# Patient Record
Sex: Male | Born: 1945
Health system: Southern US, Community
[De-identification: ages and names within clinical notes are randomized; demographics above are authoritative.]

## PROBLEM LIST (undated history)

## (undated) DIAGNOSIS — M75101 Unspecified rotator cuff tear or rupture of right shoulder, not specified as traumatic: Secondary | ICD-10-CM

## (undated) DIAGNOSIS — F419 Anxiety disorder, unspecified: Secondary | ICD-10-CM

## (undated) DIAGNOSIS — I480 Paroxysmal atrial fibrillation: Principal | ICD-10-CM

## (undated) DIAGNOSIS — I10 Essential (primary) hypertension: Secondary | ICD-10-CM

## (undated) DIAGNOSIS — E785 Hyperlipidemia, unspecified: Secondary | ICD-10-CM

## (undated) HISTORY — DX: Paroxysmal atrial fibrillation: I48.0

## (undated) HISTORY — PX: HERNIA REPAIR: SHX51

## (undated) HISTORY — PX: TONSILLECTOMY AND ADENOIDECTOMY: SUR1326

## (undated) HISTORY — DX: Hyperlipidemia, unspecified: E78.5

---

## 2003-07-12 ENCOUNTER — Encounter: Admission: RE | Admit: 2003-07-12 | Discharge: 2003-08-10 | Payer: Self-pay | Admitting: Family Medicine

## 2011-05-13 ENCOUNTER — Encounter (HOSPITAL_COMMUNITY): Payer: Self-pay

## 2011-05-13 ENCOUNTER — Emergency Department (HOSPITAL_COMMUNITY): Payer: BC Managed Care – PPO

## 2011-05-13 ENCOUNTER — Emergency Department (HOSPITAL_COMMUNITY)
Admission: EM | Admit: 2011-05-13 | Discharge: 2011-05-13 | Disposition: A | Discharge status: Home / Self Care | Payer: BC Managed Care – PPO | Attending: Emergency Medicine | Admitting: Emergency Medicine

## 2011-05-13 DIAGNOSIS — F29 Unspecified psychosis not due to a substance or known physiological condition: Secondary | ICD-10-CM | POA: Insufficient documentation

## 2011-05-13 DIAGNOSIS — R5383 Other fatigue: Secondary | ICD-10-CM | POA: Insufficient documentation

## 2011-05-13 DIAGNOSIS — R4701 Aphasia: Secondary | ICD-10-CM | POA: Insufficient documentation

## 2011-05-13 DIAGNOSIS — E785 Hyperlipidemia, unspecified: Secondary | ICD-10-CM | POA: Insufficient documentation

## 2011-05-13 DIAGNOSIS — E119 Type 2 diabetes mellitus without complications: Secondary | ICD-10-CM | POA: Insufficient documentation

## 2011-05-13 DIAGNOSIS — R61 Generalized hyperhidrosis: Secondary | ICD-10-CM | POA: Insufficient documentation

## 2011-05-13 DIAGNOSIS — Z79899 Other long term (current) drug therapy: Secondary | ICD-10-CM | POA: Insufficient documentation

## 2011-05-13 DIAGNOSIS — R000 Tachycardia, unspecified: Secondary | ICD-10-CM | POA: Insufficient documentation

## 2011-05-13 DIAGNOSIS — N390 Urinary tract infection, site not specified: Secondary | ICD-10-CM | POA: Insufficient documentation

## 2011-05-13 DIAGNOSIS — I10 Essential (primary) hypertension: Secondary | ICD-10-CM | POA: Insufficient documentation

## 2011-05-13 DIAGNOSIS — R4182 Altered mental status, unspecified: Secondary | ICD-10-CM | POA: Insufficient documentation

## 2011-05-13 DIAGNOSIS — R0602 Shortness of breath: Secondary | ICD-10-CM | POA: Insufficient documentation

## 2011-05-13 DIAGNOSIS — R5381 Other malaise: Secondary | ICD-10-CM | POA: Insufficient documentation

## 2011-05-13 HISTORY — DX: Essential (primary) hypertension: I10

## 2011-05-13 LAB — URINALYSIS, ROUTINE W REFLEX MICROSCOPIC
Bilirubin Urine: NEGATIVE
Glucose, UA: NEGATIVE mg/dL
Ketones, ur: 15 mg/dL — AB
Protein, ur: NEGATIVE mg/dL

## 2011-05-13 LAB — URINE MICROSCOPIC-ADD ON

## 2011-05-13 LAB — DIFFERENTIAL
Basophils Absolute: 0 10*3/uL (ref 0.0–0.1)
Basophils Relative: 0 % (ref 0–1)
Eosinophils Absolute: 0 10*3/uL (ref 0.0–0.7)
Monocytes Absolute: 0 10*3/uL — ABNORMAL LOW (ref 0.1–1.0)
Monocytes Relative: 0 % — ABNORMAL LOW (ref 3–12)
Neutrophils Relative %: 97 % — ABNORMAL HIGH (ref 43–77)

## 2011-05-13 LAB — BASIC METABOLIC PANEL
BUN: 21 mg/dL (ref 6–23)
CO2: 23 mEq/L (ref 19–32)
Calcium: 8.6 mg/dL (ref 8.4–10.5)
Creatinine, Ser: 0.81 mg/dL (ref 0.50–1.35)
Glucose, Bld: 143 mg/dL — ABNORMAL HIGH (ref 70–99)

## 2011-05-13 LAB — CBC
MCH: 29 pg (ref 26.0–34.0)
MCHC: 33.1 g/dL (ref 30.0–36.0)
Platelets: 133 10*3/uL — ABNORMAL LOW (ref 150–400)
RBC: 4.49 MIL/uL (ref 4.22–5.81)

## 2011-05-13 LAB — CK TOTAL AND CKMB (NOT AT ARMC)
CK, MB: 4.8 ng/mL — ABNORMAL HIGH (ref 0.3–4.0)
Total CK: 204 U/L (ref 7–232)

## 2011-07-21 ENCOUNTER — Encounter (INDEPENDENT_AMBULATORY_CARE_PROVIDER_SITE_OTHER): Payer: Self-pay | Admitting: Surgery

## 2011-07-24 ENCOUNTER — Ambulatory Visit (INDEPENDENT_AMBULATORY_CARE_PROVIDER_SITE_OTHER): Payer: BC Managed Care – PPO | Admitting: Surgery

## 2011-07-24 ENCOUNTER — Encounter (INDEPENDENT_AMBULATORY_CARE_PROVIDER_SITE_OTHER): Payer: Self-pay | Admitting: Surgery

## 2011-07-24 VITALS — BP 138/84 | HR 60 | Temp 97.2°F | Resp 16 | Ht 67.0 in | Wt 153.0 lb

## 2011-07-24 DIAGNOSIS — K4090 Unilateral inguinal hernia, without obstruction or gangrene, not specified as recurrent: Secondary | ICD-10-CM | POA: Insufficient documentation

## 2011-07-24 NOTE — Patient Instructions (Signed)
Hold your aspirin for five days before surgery.

## 2011-07-24 NOTE — Progress Notes (Signed)
Chief Complaint  Patient presents with  . discuss surgery for LIH    HPI Daniel Taylor is a 65 y.o. male. I saw him 2 years ago for a left inguinal hernia. The patient chose not to have it repaired at that time. Has gotten significantly larger last couple years but remains reducible. He denies any obstructive symptoms. No tenderness. HPI  Past Medical History  Diagnosis Date  . Hypertension   . Diabetes mellitus   . Hyperlipidemia     Past Surgical History  Procedure Date  . Hernia repair as baby  . Tonsillectomy and adenoidectomy 55 years old    Family History  Problem Relation Age of Onset  . Cancer Father     Social History History  Substance Use Topics  . Smoking status: Never Smoker   . Smokeless tobacco: Not on file  . Alcohol Use: Yes    No Known Allergies  Current Outpatient Prescriptions  Medication Sig Dispense Refill  . aspirin 325 MG EC tablet Take 325 mg by mouth daily.        Marland Kitchen atorvastatin (LIPITOR) 10 MG tablet Take 10 mg by mouth daily.        Marland Kitchen lisinopril (PRINIVIL,ZESTRIL) 40 MG tablet Take 40 mg by mouth daily.        . metFORMIN (GLUCOPHAGE) 500 MG tablet Take 500 mg by mouth 2 (two) times daily with a meal.          Review of Systems Review of Systems ROS reviewed with patient and is otherwise negative Blood pressure 138/84, pulse 60, temperature 97.2 F (36.2 C), temperature source Temporal, resp. rate 16, height 5\' 7"  (1.702 m), weight 153 lb (69.4 kg).  Physical Exam Physical Exam WDWN in NAD HEENT:  EOMI, sclera anicteric Neck:  No masses, no thyromegaly Lungs:  CTA bilaterally; normal respiratory effort CV:  Regular rate and rhythm; no murmurs Abd:  +bowel sounds, soft, non-tender, no masses GU:  Bilateral descended testicles; no testicular masses; reducible large left inguinal hernia; no sign of right inguinal hernia on Valsalva maneuver. Ext:  Well-perfused; no edema Skin:  Warm, dry; no sign of jaundice  Data  Reviewed None  Assessment    Large reducible left inguinal hernia    Plan    Left inguinal hernia repair with mesh.  I described the procedure in detail.  The patient was given educational material. We discussed the risks and benefits including but not limited to bleeding, infection, chronic inguinal pain, nerve entrapment, hernia recurrence, mesh complications, hematoma formation, urinary retention, injury to the testicles, numbness in the groin, blood clots, injury to the surrounding structures, and anesthesia risk. We also discussed the typical post operative recovery course, including no heavy lifting for 6 weeks.  Likelihood of improvement in symptoms is high.        Ajai Harville K. 07/24/2011, 11:31 AM

## 2011-07-28 ENCOUNTER — Encounter (INDEPENDENT_AMBULATORY_CARE_PROVIDER_SITE_OTHER): Payer: Self-pay | Admitting: Surgery

## 2011-08-29 ENCOUNTER — Encounter (HOSPITAL_COMMUNITY): Payer: Self-pay

## 2011-09-08 ENCOUNTER — Encounter (HOSPITAL_COMMUNITY): Payer: Self-pay

## 2011-09-08 ENCOUNTER — Encounter (HOSPITAL_COMMUNITY)
Admission: RE | Admit: 2011-09-08 | Discharge: 2011-09-08 | Disposition: A | Discharge status: Home / Self Care | Payer: BC Managed Care – PPO | Source: Ambulatory Visit | Attending: Surgery | Admitting: Surgery

## 2011-09-08 HISTORY — DX: Anxiety disorder, unspecified: F41.9

## 2011-09-08 LAB — BASIC METABOLIC PANEL
CO2: 28 mEq/L (ref 19–32)
Calcium: 9.6 mg/dL (ref 8.4–10.5)
Chloride: 103 mEq/L (ref 96–112)
Glucose, Bld: 167 mg/dL — ABNORMAL HIGH (ref 70–99)
Sodium: 142 mEq/L (ref 135–145)

## 2011-09-08 LAB — CBC
Hemoglobin: 13.7 g/dL (ref 13.0–17.0)
MCH: 28.8 pg (ref 26.0–34.0)
MCV: 89.5 fL (ref 78.0–100.0)
RBC: 4.76 MIL/uL (ref 4.22–5.81)

## 2011-09-08 NOTE — Progress Notes (Signed)
ekg  And cxr in epic

## 2011-09-08 NOTE — Pre-Procedure Instructions (Signed)
21 Daniel Taylor  09/08/2011   Your procedure is scheduled on:  09/10/11  Report to [facility] at 830 AM.  Call this number if you have problems the morning of surgery: [phone]   Remember:   Do not eat food:After Midnight.  May have clear liquids: up to 4 Hours before arrival.  Clear liquids include soda, tea, black coffee, apple or grape juice, broth.  Take these medicines the morning of surgery with A SIP OF WATER: none   Do not wear jewelry, make-up or nail polish.  Do not wear lotions, powders, or perfumes. You may wear deodorant.  Do not shave 48 hours prior to surgery.  Do not bring valuables to the hospital.  Contacts, dentures or bridgework may not be worn into surgery.  Leave suitcase in the car. After surgery it may be brought to your room.  For patients admitted to the hospital, checkout time is 11:00 AM the day of discharge.   Patients discharged the day of surgery will not be allowed to drive home.  Name and phone number of your driver: family  Special Instructions: CHG Shower Use Special Wash: 1/2 bottle night before surgery and 1/2 bottle morning of surgery.   Please read over the following fact sheets that you were given: MRSA Information and Surgical Site Infection Prevention

## 2011-09-09 MED ORDER — CEFAZOLIN SODIUM 1-5 GM-% IV SOLN
1.0000 g | INTRAVENOUS | Status: AC
  Administered 2011-09-10: 1 g via INTRAVENOUS
  Filled 2011-09-09: qty 50

## 2011-09-10 ENCOUNTER — Encounter (HOSPITAL_COMMUNITY): Payer: Self-pay | Admitting: Surgery

## 2011-09-10 ENCOUNTER — Encounter (HOSPITAL_COMMUNITY): Payer: Self-pay | Admitting: Anesthesiology

## 2011-09-10 ENCOUNTER — Ambulatory Visit (HOSPITAL_COMMUNITY)
Admission: RE | Admit: 2011-09-10 | Discharge: 2011-09-10 | Disposition: A | Discharge status: Home / Self Care | Payer: BC Managed Care – PPO | Source: Ambulatory Visit | Attending: Surgery | Admitting: Surgery

## 2011-09-10 ENCOUNTER — Ambulatory Visit (HOSPITAL_COMMUNITY): Payer: BC Managed Care – PPO | Admitting: Anesthesiology

## 2011-09-10 ENCOUNTER — Encounter (HOSPITAL_COMMUNITY)
Admission: RE | Disposition: A | Discharge status: Home / Self Care | Payer: Self-pay | Source: Ambulatory Visit | Attending: Surgery

## 2011-09-10 DIAGNOSIS — K4090 Unilateral inguinal hernia, without obstruction or gangrene, not specified as recurrent: Secondary | ICD-10-CM

## 2011-09-10 DIAGNOSIS — E119 Type 2 diabetes mellitus without complications: Secondary | ICD-10-CM | POA: Insufficient documentation

## 2011-09-10 DIAGNOSIS — K4091 Unilateral inguinal hernia, without obstruction or gangrene, recurrent: Secondary | ICD-10-CM | POA: Insufficient documentation

## 2011-09-10 DIAGNOSIS — I10 Essential (primary) hypertension: Secondary | ICD-10-CM | POA: Insufficient documentation

## 2011-09-10 DIAGNOSIS — Z01812 Encounter for preprocedural laboratory examination: Secondary | ICD-10-CM | POA: Insufficient documentation

## 2011-09-10 HISTORY — PX: INGUINAL HERNIA REPAIR: SHX194

## 2011-09-10 SURGERY — REPAIR, HERNIA, INGUINAL, ADULT
Anesthesia: General | Site: Groin | Laterality: Left | Wound class: Clean

## 2011-09-10 MED ORDER — ARTIFICIAL TEARS OP OINT
TOPICAL_OINTMENT | OPHTHALMIC | Status: DC | PRN
  Administered 2011-09-10: 1 via OPHTHALMIC

## 2011-09-10 MED ORDER — ROCURONIUM BROMIDE 100 MG/10ML IV SOLN
INTRAVENOUS | Status: DC | PRN
  Administered 2011-09-10: 30 mg via INTRAVENOUS

## 2011-09-10 MED ORDER — OXYCODONE-ACETAMINOPHEN 5-325 MG PO TABS: 1.0000 | ORAL_TABLET | ORAL | Status: AC | PRN

## 2011-09-10 MED ORDER — LACTATED RINGERS IV SOLN
INTRAVENOUS | Status: DC
  Administered 2011-09-10 (×3): via INTRAVENOUS

## 2011-09-10 MED ORDER — HYDROMORPHONE HCL PF 1 MG/ML IJ SOLN
0.2500 mg | INTRAMUSCULAR | Status: DC | PRN
  Administered 2011-09-10: 0.29 mg via INTRAVENOUS

## 2011-09-10 MED ORDER — ONDANSETRON HCL 4 MG/2ML IJ SOLN: 4.0000 mg | Freq: Once | INTRAMUSCULAR | Status: DC | PRN

## 2011-09-10 MED ORDER — MIDAZOLAM HCL 5 MG/5ML IJ SOLN
INTRAMUSCULAR | Status: DC | PRN
  Administered 2011-09-10: 2 mg via INTRAVENOUS

## 2011-09-10 MED ORDER — KETOROLAC TROMETHAMINE 30 MG/ML IJ SOLN
INTRAMUSCULAR | Status: DC | PRN
  Administered 2011-09-10: 30 mg via INTRAVENOUS

## 2011-09-10 MED ORDER — FLUMAZENIL 0.5 MG/5ML IV SOLN
0.5000 mg | Freq: Once | INTRAVENOUS | Status: AC
  Administered 2011-09-10: 0.5 mg via INTRAVENOUS

## 2011-09-10 MED ORDER — HYDROMORPHONE HCL PF 1 MG/ML IJ SOLN: 1.0000 mg | INTRAMUSCULAR | Status: DC | PRN

## 2011-09-10 MED ORDER — ONDANSETRON HCL 4 MG/2ML IJ SOLN
INTRAMUSCULAR | Status: DC | PRN
  Administered 2011-09-10: 4 mg via INTRAVENOUS

## 2011-09-10 MED ORDER — GLYCOPYRROLATE 0.2 MG/ML IJ SOLN
INTRAMUSCULAR | Status: DC | PRN
  Administered 2011-09-10 (×2): 0.2 mg via INTRAVENOUS

## 2011-09-10 MED ORDER — BUPIVACAINE LIPOSOME 1.3 % IJ SUSP
20.0000 mL | Freq: Once | INTRAMUSCULAR | Status: DC
  Filled 2011-09-10: qty 20

## 2011-09-10 MED ORDER — FENTANYL CITRATE 0.05 MG/ML IJ SOLN
INTRAMUSCULAR | Status: DC | PRN
  Administered 2011-09-10: 100 ug via INTRAVENOUS

## 2011-09-10 MED ORDER — NEOSTIGMINE METHYLSULFATE 1 MG/ML IJ SOLN
INTRAMUSCULAR | Status: DC | PRN
  Administered 2011-09-10: 3 mg via INTRAVENOUS

## 2011-09-10 MED ORDER — PROPOFOL 10 MG/ML IV EMUL
INTRAVENOUS | Status: DC | PRN
  Administered 2011-09-10: 200 mg via INTRAVENOUS

## 2011-09-10 MED ORDER — OXYCODONE-ACETAMINOPHEN 5-325 MG PO TABS: 1.0000 | ORAL_TABLET | ORAL | Status: DC | PRN

## 2011-09-10 MED ORDER — BUPIVACAINE LIPOSOME 1.3 % IJ SUSP
INTRAMUSCULAR | Status: DC | PRN
  Administered 2011-09-10: 20 mL

## 2011-09-10 MED ORDER — FLUMAZENIL 0.5 MG/5ML IV SOLN
INTRAVENOUS | Status: AC
  Filled 2011-09-10: qty 5

## 2011-09-10 MED ORDER — ONDANSETRON HCL 4 MG/2ML IJ SOLN: 4.0000 mg | INTRAMUSCULAR | Status: DC | PRN

## 2011-09-10 SURGICAL SUPPLY — 52 items
BENZOIN TINCTURE PRP APPL 2/3 (GAUZE/BANDAGES/DRESSINGS) ×2 IMPLANT
BLADE SURG 15 STRL LF DISP TIS (BLADE) ×1 IMPLANT
BLADE SURG 15 STRL SS (BLADE) ×1
BLADE SURG ROTATE 9660 (MISCELLANEOUS) ×2 IMPLANT
CHLORAPREP W/TINT 26ML (MISCELLANEOUS) ×2 IMPLANT
CLOSURE STERI STRIP 1/2 X4 (GAUZE/BANDAGES/DRESSINGS) ×2 IMPLANT
CLOTH BEACON ORANGE TIMEOUT ST (SAFETY) ×2 IMPLANT
COVER SURGICAL LIGHT HANDLE (MISCELLANEOUS) ×2 IMPLANT
DECANTER SPIKE VIAL GLASS SM (MISCELLANEOUS) ×2 IMPLANT
DERMABOND ADVANCED (GAUZE/BANDAGES/DRESSINGS) ×1
DERMABOND ADVANCED .7 DNX12 (GAUZE/BANDAGES/DRESSINGS) ×1 IMPLANT
DRAIN PENROSE 1/2X12 LTX STRL (WOUND CARE) ×2 IMPLANT
DRAPE LAPAROSCOPIC ABDOMINAL (DRAPES) ×2 IMPLANT
DRAPE LAPAROTOMY TRNSV 102X78 (DRAPE) ×2 IMPLANT
DRAPE UTILITY 15X26 W/TAPE STR (DRAPE) ×4 IMPLANT
DRSG TEGADERM 4X4.75 (GAUZE/BANDAGES/DRESSINGS) ×2 IMPLANT
ELECT CAUTERY BLADE 6.4 (BLADE) ×2 IMPLANT
ELECT REM PT RETURN 9FT ADLT (ELECTROSURGICAL) ×2
ELECTRODE REM PT RTRN 9FT ADLT (ELECTROSURGICAL) ×1 IMPLANT
GAUZE SPONGE 4X4 16PLY XRAY LF (GAUZE/BANDAGES/DRESSINGS) ×2 IMPLANT
GLOVE BIO SURGEON STRL SZ7 (GLOVE) ×2 IMPLANT
GLOVE BIOGEL PI IND STRL 7.5 (GLOVE) ×1 IMPLANT
GLOVE BIOGEL PI INDICATOR 7.5 (GLOVE) ×1
GOWN STRL NON-REIN LRG LVL3 (GOWN DISPOSABLE) ×4 IMPLANT
KIT BASIN OR (CUSTOM PROCEDURE TRAY) ×2 IMPLANT
KIT ROOM TURNOVER OR (KITS) ×2 IMPLANT
MESH ULTRAPRO 3X6 7.6X15CM (Mesh General) ×2 IMPLANT
NEEDLE HYPO 25GX1X1/2 BEV (NEEDLE) ×2 IMPLANT
NS IRRIG 1000ML POUR BTL (IV SOLUTION) ×2 IMPLANT
PACK SURGICAL SETUP 50X90 (CUSTOM PROCEDURE TRAY) ×2 IMPLANT
PAD ARMBOARD 7.5X6 YLW CONV (MISCELLANEOUS) ×2 IMPLANT
PENCIL BUTTON HOLSTER BLD 10FT (ELECTRODE) ×2 IMPLANT
SPECIMEN JAR SMALL (MISCELLANEOUS) IMPLANT
SPONGE GAUZE 4X4 12PLY (GAUZE/BANDAGES/DRESSINGS) ×2 IMPLANT
SPONGE GAUZE 4X4 STERILE 39 (GAUZE/BANDAGES/DRESSINGS) ×2 IMPLANT
SPONGE INTESTINAL PEANUT (DISPOSABLE) ×2 IMPLANT
STRIP CLOSURE SKIN 1/2X4 (GAUZE/BANDAGES/DRESSINGS) ×2 IMPLANT
SUT MNCRL AB 4-0 PS2 18 (SUTURE) ×2 IMPLANT
SUT PDS AB 0 CT 36 (SUTURE) ×2 IMPLANT
SUT PROLENE 2 0 SH DA (SUTURE) ×2 IMPLANT
SUT SILK 2 0 SH (SUTURE) IMPLANT
SUT SILK 3 0 (SUTURE) ×1
SUT SILK 3-0 18XBRD TIE 12 (SUTURE) ×1 IMPLANT
SUT VIC AB 0 CT2 27 (SUTURE) ×2 IMPLANT
SUT VIC AB 2-0 SH 27 (SUTURE)
SUT VIC AB 2-0 SH 27X BRD (SUTURE) IMPLANT
SUT VIC AB 3-0 SH 27 (SUTURE)
SUT VIC AB 3-0 SH 27XBRD (SUTURE) IMPLANT
SYR CONTROL 10ML LL (SYRINGE) ×2 IMPLANT
TOWEL OR 17X24 6PK STRL BLUE (TOWEL DISPOSABLE) ×2 IMPLANT
TOWEL OR 17X26 10 PK STRL BLUE (TOWEL DISPOSABLE) ×2 IMPLANT
WATER STERILE IRR 1000ML POUR (IV SOLUTION) IMPLANT

## 2011-09-10 NOTE — Anesthesia Postprocedure Evaluation (Signed)
  Anesthesia Post-op Note  Patient: Daniel Taylor  Procedure(s) Performed:  HERNIA REPAIR INGUINAL ADULT - REPAIR LEFT HERNIA INGUINAL ADULT WITH MESH  Patient Location: PACU  Anesthesia Type: General  Level of Consciousness: oriented, sedated and patient cooperative  Airway and Oxygen Therapy: Patient Spontanous Breathing and Patient connected to nasal cannula oxygen  Post-op Pain: mild  Post-op Assessment: Post-op Vital signs reviewed, Patient's Cardiovascular Status Stable, Respiratory Function Stable, Patent Airway, No signs of Nausea or vomiting and Pain level controlled  Post-op Vital Signs: stable  Complications: No apparent anesthesia complications

## 2011-09-10 NOTE — Op Note (Signed)
Hernia, Open, Procedure Note  Indications: The patient presented with a history of a left, reducible inguinal hernia.    Pre-operative Diagnosis: left reducible  Post-operative Diagnosis: same  Surgeon: Wynona Luna.   Assistants: none  Anesthesia: General endotracheal anesthesia  ASA Class: 3  Procedure Details  The patient was seen again in the Holding Room. The risks, benefits, complications, treatment options, and expected outcomes were discussed with the patient. The possibilities of reaction to medication, pulmonary aspiration, perforation of viscus, bleeding, recurrent infection, the need for additional procedures, and development of a complication requiring transfusion or further operation were discussed with the patient and/or family. The likelihood of success in repairing the hernia and returning the patient to their previous functional status is good.  There was concurrence with the proposed plan, and informed consent was obtained. The site of surgery was properly noted/marked. The patient was taken to the Operating Room, identified as Daniel Taylor, and the procedure verified as left inguinal hernia repair. A Time Out was held and the above information confirmed.  The patient was placed in the supine position and underwent induction of anesthesia. The lower abdomen and groin was prepped with Chloraprep and draped in the standard fashion, and 0.5% Marcaine with epinephrine was used to anesthetize the skin over the mid-portion of the inguinal canal. An oblique incision was made.  He had a previous left inguinal incision from a childhood hernia repair, but this was too low for our purpose. Dissection was carried down through the subcutaneous tissue with cautery to the external oblique fascia.  There was considerable scarring inferiorly. We opened the external oblique fascia along the direction of its fibers to the external ring.  The spermatic cord was circumferentially dissected  bluntly and retracted with a Penrose drain.  The floor of the inguinal canal was inspected and had a large direct hernia defect.  We skeletonized the spermatic cord and there was no indirect hernia noted.  The floor of the inguinal canal was closed with a running 0 Vicryl.  We used a 3 x 6 inch piece of Ultrapro mesh, which was cut into a keyhole shape.  This was secured with 2-0 Prolene, beginning at the pubic tubercle, running this along the internal oblique fascia superiorly and the shelving edge inferiorly.  The tails of the mesh were sutured together behind the spermatic cord.  The mesh was tucked underneath the external oblique fascia laterally.  The external oblique fascia was reapproximated with 2-0 Vicryl. 20 ml of Exparel was infiltrated in the subcutaneous tissues. 3-0 Vicryl was used to close the subcutaneous tissues and 4-0 Monocryl was used to close the skin in subcuticular fashion.  Benzoin and steri-strips were used to seal the incision.  A clean dressing was applied.  The patient was then extubated and brought to the recovery room in stable condition.  All sponge, instrument, and needle counts were correct prior to closure and at the conclusion of the case.   Estimated Blood Loss: Minimal                 Complications: None; patient tolerated the procedure well.         Disposition: PACU - hemodynamically stable.         Condition: stable

## 2011-09-10 NOTE — H&P (Signed)
Progress Notes     Chief Complaint   Patient presents with   .  discuss surgery for LIH        HPI Daniel Taylor is a 65 y.o. male. I saw him 2 years ago for a left inguinal hernia. The patient chose not to have it repaired at that time. Has gotten significantly larger last couple years but remains reducible. He denies any obstructive symptoms. No tenderness. HPI    Past Medical History   Diagnosis  Date   .  Hypertension     .  Diabetes mellitus     .  Hyperlipidemia           Past Surgical History   Procedure  Date   .  Hernia repair  as baby   .  Tonsillectomy and adenoidectomy  65 years old         Family History   Problem  Relation  Age of Onset   .  Cancer  Father          Social History History   Substance Use Topics   .  Smoking status:  Never Smoker    .  Smokeless tobacco:  Not on file   .  Alcohol Use:  Yes        No Known Allergies    Current Outpatient Prescriptions   Medication  Sig  Dispense  Refill   .  aspirin 325 MG EC tablet  Take 325 mg by mouth daily.           Marland Kitchen  atorvastatin (LIPITOR) 10 MG tablet  Take 10 mg by mouth daily.           Marland Kitchen  lisinopril (PRINIVIL,ZESTRIL) 40 MG tablet  Take 40 mg by mouth daily.           .  metFORMIN (GLUCOPHAGE) 500 MG tablet  Take 500 mg by mouth 2 (two) times daily with a meal.                Review of Systems Review of Systems ROS reviewed with patient and is otherwise negative Blood pressure 138/84, pulse 60, temperature 97.2 F (36.2 C), temperature source Temporal, resp. rate 16, height 5\' 7"  (1.702 m), weight 153 lb (69.4 kg).   Physical Exam Physical Exam WDWN in NAD HEENT:  EOMI, sclera anicteric Neck:  No masses, no thyromegaly Lungs:  CTA bilaterally; normal respiratory effort CV:  Regular rate and rhythm; no murmurs Abd:  +bowel sounds, soft, non-tender, no masses GU:  Bilateral descended testicles; no testicular masses; reducible large left inguinal hernia; no sign of right  inguinal hernia on Valsalva maneuver. Ext:  Well-perfused; no edema Skin:  Warm, dry; no sign of jaundice   Data Reviewed None   Assessment    Large reducible left inguinal hernia     Plan    Left inguinal hernia repair with mesh.  I described the procedure in detail.  The patient was given educational material. We discussed the risks and benefits including but not limited to bleeding, infection, chronic inguinal pain, nerve entrapment, hernia recurrence, mesh complications, hematoma formation, urinary retention, injury to the testicles, numbness in the groin, blood clots, injury to the surrounding structures, and anesthesia risk. We also discussed the typical post operative recovery course, including no heavy lifting for 6 weeks.  Likelihood of improvement in symptoms is high.            Janely Gullickson K. 09/10/2011 7:21 AM

## 2011-09-10 NOTE — Transfer of Care (Signed)
Immediate Anesthesia Transfer of Care Note  Patient: Daniel Taylor  Procedure(s) Performed:  HERNIA REPAIR INGUINAL ADULT - REPAIR LEFT HERNIA INGUINAL ADULT WITH MESH  Patient Location: PACU  Anesthesia Type: General  Level of Consciousness: sedated and patient cooperative  Airway & Oxygen Therapy: Patient Spontanous Breathing and Patient connected to nasal cannula oxygen  Post-op Assessment: Report given to PACU RN and Post -op Vital signs reviewed and stable  Post vital signs: Reviewed and stable  Complications: No apparent anesthesia complications

## 2011-09-10 NOTE — Preoperative (Signed)
Beta Blockers   Reason not to administer Beta Blockers:Not Applicable 

## 2011-09-10 NOTE — Anesthesia Preprocedure Evaluation (Addendum)
Anesthesia Evaluation  Patient identified by MRN, date of birth, ID band Patient awake    Reviewed: Allergy & Precautions, H&P , NPO status , Patient's Chart, lab work & pertinent test results  Airway Mallampati: I TM Distance: >3 FB Neck ROM: full    Dental  (+) Teeth Intact, Caps and Dental Advisory Given   Pulmonary neg pulmonary ROS,    Pulmonary exam normal       Cardiovascular Exercise Tolerance: Good hypertension, Pt. on medications regular Normal    Neuro/Psych    GI/Hepatic negative GI ROS, Neg liver ROS,   Endo/Other  Diabetes mellitus-, Type 2, Oral Hypoglycemic Agents  Renal/GU negative Renal ROS  Genitourinary negative   Musculoskeletal   Abdominal   Peds  Hematology   Anesthesia Other Findings   Reproductive/Obstetrics                          Anesthesia Physical Anesthesia Plan  ASA: II  Anesthesia Plan: General   Post-op Pain Management:    Induction: Intravenous  Airway Management Planned: Oral ETT  Additional Equipment:   Intra-op Plan:   Post-operative Plan:   Informed Consent: I have reviewed the patients History and Physical, chart, labs and discussed the procedure including the risks, benefits and alternatives for the proposed anesthesia with the patient or authorized representative who has indicated his/her understanding and acceptance.     Plan Discussed with: Anesthesiologist, CRNA and Surgeon  Anesthesia Plan Comments:         Anesthesia Quick Evaluation

## 2011-09-10 NOTE — Anesthesia Procedure Notes (Signed)
Procedure Name: Intubation Date/Time: 09/10/2011 10:57 AM Performed by: Leona Singleton A. Oxygen Delivery Method: Circle System Utilized Preoxygenation: Pre-oxygenation with 100% oxygen Intubation Type: IV induction Ventilation: Mask ventilation without difficulty Laryngoscope Size: Miller and 2 Grade View: Grade I Tube type: Oral Tube size: 7.5 mm Number of attempts: 1 Placement Confirmation: ETT inserted through vocal cords under direct vision,  positive ETCO2 and breath sounds checked- equal and bilateral Secured at: 21 cm Tube secured with: Tape Dental Injury: Teeth and Oropharynx as per pre-operative assessment

## 2011-09-12 ENCOUNTER — Encounter (HOSPITAL_COMMUNITY): Payer: Self-pay | Admitting: Surgery

## 2011-09-29 ENCOUNTER — Ambulatory Visit (INDEPENDENT_AMBULATORY_CARE_PROVIDER_SITE_OTHER): Payer: BC Managed Care – PPO | Admitting: Surgery

## 2011-09-29 ENCOUNTER — Encounter (INDEPENDENT_AMBULATORY_CARE_PROVIDER_SITE_OTHER): Payer: Self-pay | Admitting: Surgery

## 2011-09-29 VITALS — BP 128/80 | HR 60 | Temp 97.6°F | Resp 18 | Ht 67.0 in | Wt 153.4 lb

## 2011-09-29 DIAGNOSIS — K4090 Unilateral inguinal hernia, without obstruction or gangrene, not specified as recurrent: Secondary | ICD-10-CM

## 2011-09-29 NOTE — Progress Notes (Signed)
S/p left inguinal hernia repair with mesh on 09/10/11 for a large direct inguinal hernia.  He is doing quite well.  Minimal soreness near the external ring.  He has some skin sensitivity around his incision.  No sign of infection or recurrent hernia.  He may resume full activity in 2 weeks.  Follow-up PRN  Filed Vitals:   09/29/11 1334  BP: 128/80  Pulse: 60  Temp: 97.6 F (36.4 C)  Resp: 18   Kolbi Tofte K. Corliss Skains, MD, [facility]  09/29/2011 1:45 PM

## 2013-02-01 IMAGING — CT CT HEAD W/O CM
1 of 2 series · 13 of 30 positions shown, 17 images · non-contrast
Comparison: None.

CLINICAL DATA: Weakness, short of breath, and confusion

CT HEAD WITHOUT CONTRAST
TECHNIQUE: Contiguous axial images were obtained from the base of
the skull through the vertex without contrast.

[Series 2: brain · axial · 0.47mm/px · z∈[+136,+271]mm · 13 of 32 slices shown, 17 images]
[im 3/32  brain]
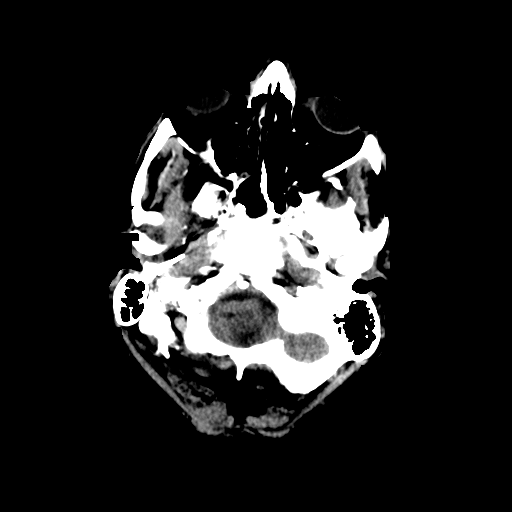
[im 3/32  bone]
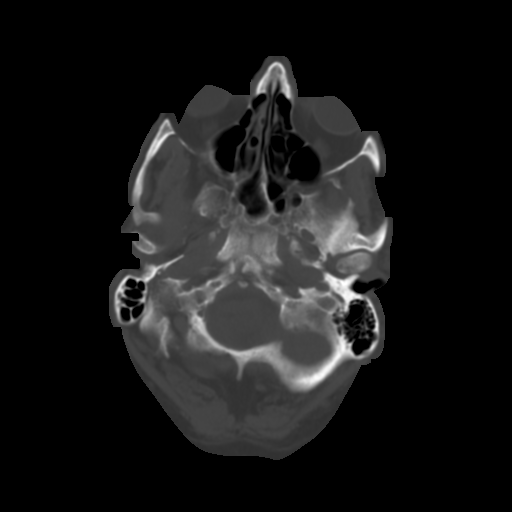
[im 5/32  brain]
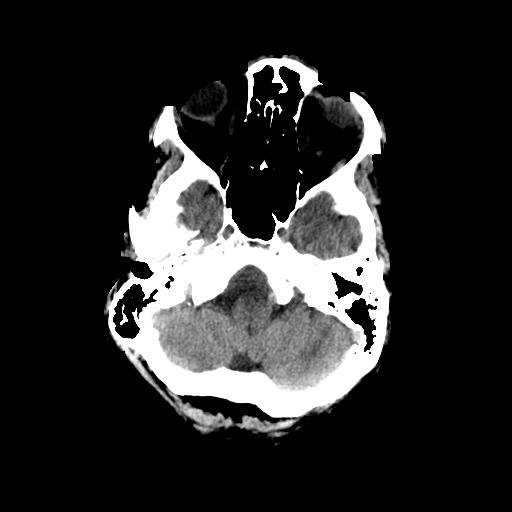
[im 7/32  brain]
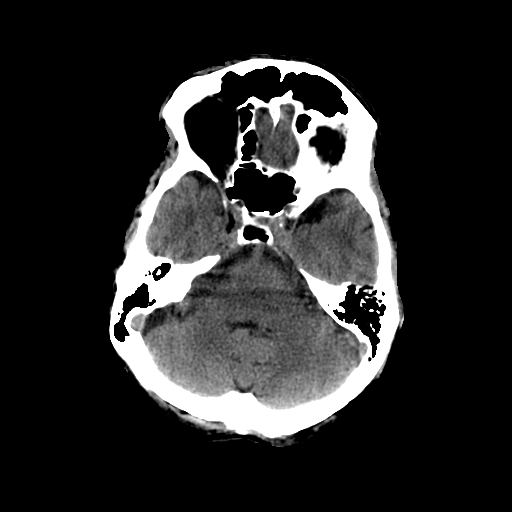
[im 9/32  brain]
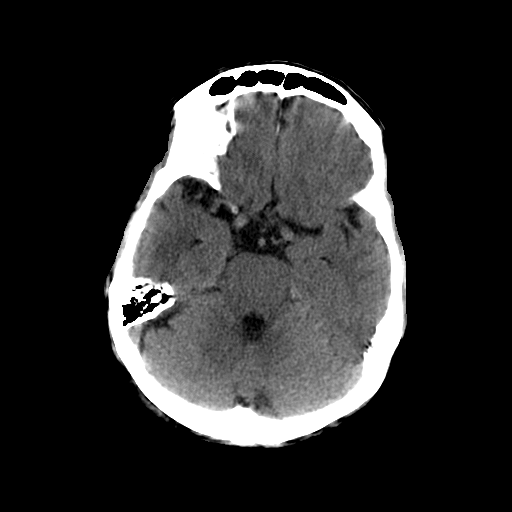
[im 12/32  brain]
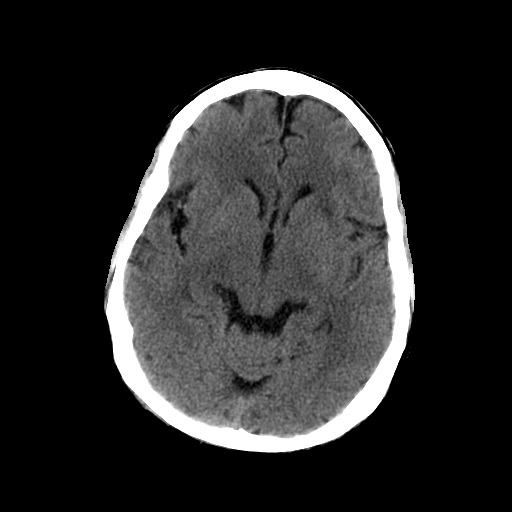
[im 12/32  bone]
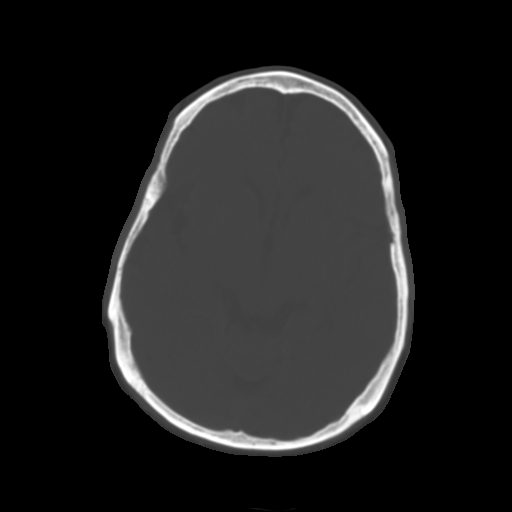
[im 14/32  brain]
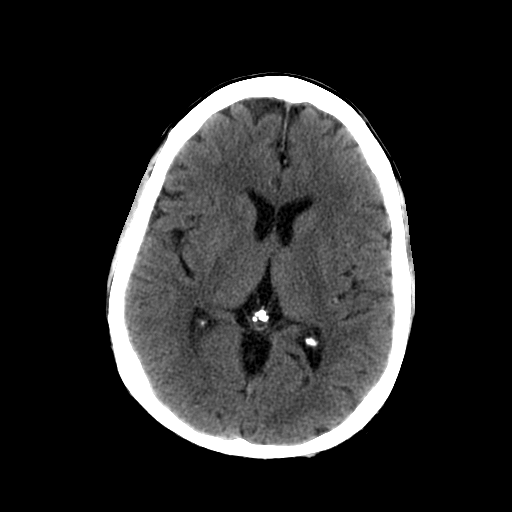
[im 16/32  brain]
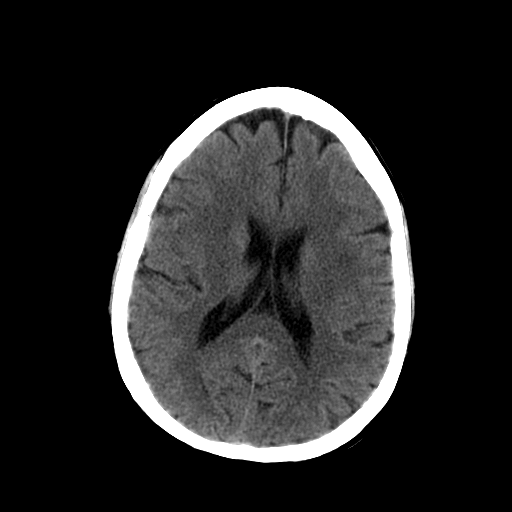
[im 18/32  brain]
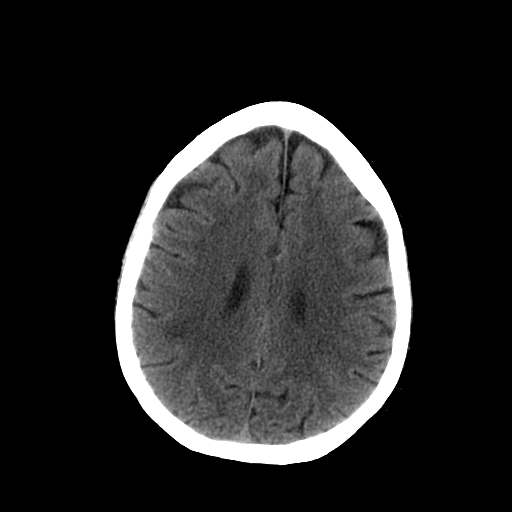
[im 20/32  brain]
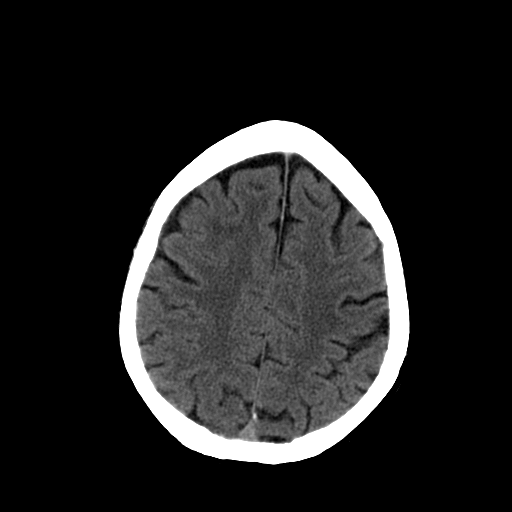
[im 20/32  bone]
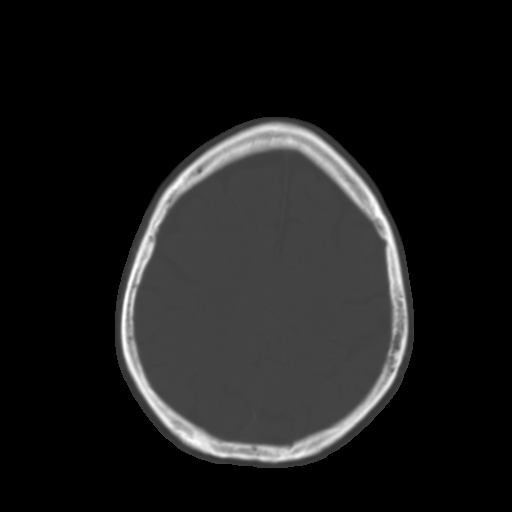
[im 23/32  brain]
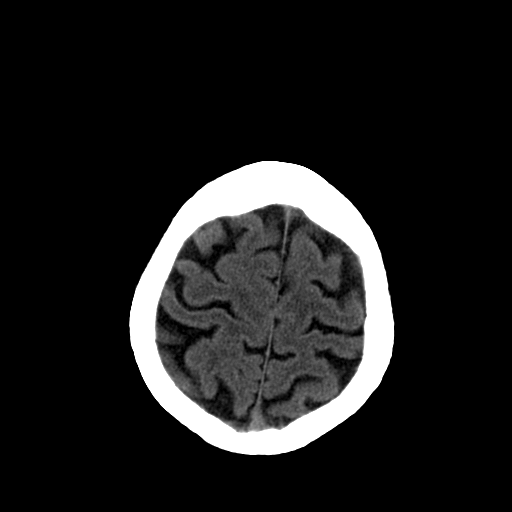
[im 25/32  brain]
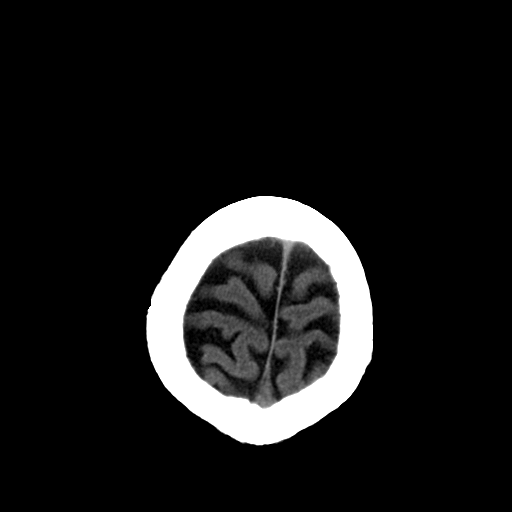
[im 27/32  brain]
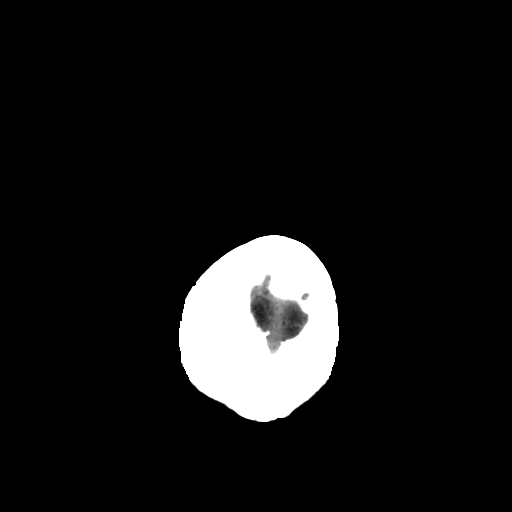
[im 29/32  brain]
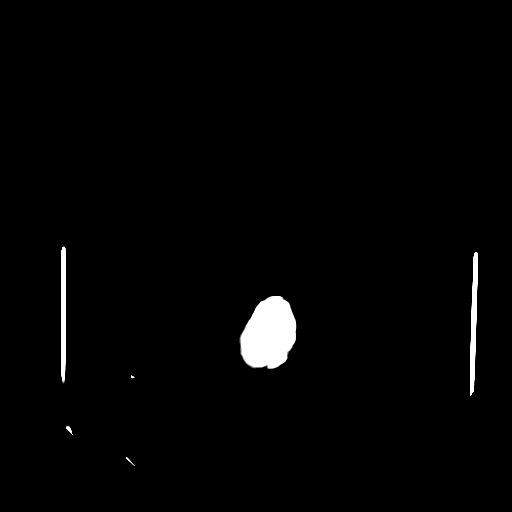
[im 29/32  bone]
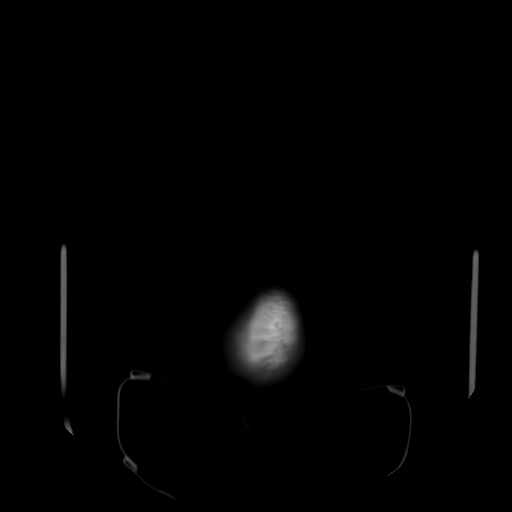

[13 of 30 positions shown; findings below may reference images not displayed]

FINDINGS: Mild cerebral atrophy.  Scattered low attenuation change
in the deep white matter suggesting small vessel ischemia.  No mass
effect or midline shift.  The gray-white matter junctions are
distinct.  The basal cisterns are not effaced.  No abnormal extra-
axial fluid collections.  No evidence of acute intracranial
hemorrhage.  Vascular calcifications in the intracranial arteries.
Tortuous and ectatic basilar artery.  No depressed skull fractures.
Visualized paranasal sinuses are not opacified. Presumed congenital
nonunion of the anterior arch of C1.
IMPRESSION: Chronic atrophy and small vessel ischemic changes.  No evidence of
acute intracranial hemorrhage, mass lesion, or acute infarct.

## 2013-02-14 ENCOUNTER — Encounter (HOSPITAL_BASED_OUTPATIENT_CLINIC_OR_DEPARTMENT_OTHER): Payer: Self-pay | Admitting: *Deleted

## 2013-02-14 NOTE — Progress Notes (Signed)
NPO AFTER MN WITH EXCEPTION CLEAR LIQUIDS UNTIL 0830 (NO CREAM/ MILK PRODUCTS). ARRIVES AT 1230. NEEDS ISTAT AND EKG. WILL TAKE HYDROCODONE AM OF SURG W/ SIP OF WATER.

## 2013-02-15 NOTE — H&P (Signed)
Daniel Taylor is an 67 y.o. male.   Chief Complaint: right shoulder pain HPI: The patient is a 67 year old male who presents with shoulder complaints. They are and present today reporting pain and popping at the right anterior shoulder that began 6 weeks ago. The patient reports that the shoulder symptoms began following a specific injury in which the patient states he fell on the ice and caught himself with his left hand. The onset of symptoms was sudden. The patient reports symptoms which include shoulder pain and popping, while reported symptoms do not include clicking, crepitation, catching or locking. The patient reports that these symptoms are present constantly. The patient describes these symptoms as mild to severe and unchanged. Symptoms are exacerbated by motion at the shoulder. Current treatment includes nonsteroidal anti-inflammatory drugs (Ibuprofen). He had an MRI of the right shoulder that showed a large full thickness supraspinatus tear with tendon retraction.  Past Medical History  Diagnosis Date  . Diabetes mellitus   . Hyperlipidemia   . Anxiety   . Hypertension     pcp  dr Tenny Craw   @ [facility]  . Right rotator cuff tear     Past Surgical History  Procedure Laterality Date  . Inguinal hernia repair  09/10/2011    Procedure: HERNIA REPAIR INGUINAL ADULT;  Surgeon: Wilmon Arms. Corliss Skains, MD;  Location: [facility] OR;  Service: General;  Laterality: Left;  REPAIR LEFT HERNIA INGUINAL ADULT WITH MESH  . Tonsillectomy and adenoidectomy  67 years old  . Hernia repair  [facility]    Family History  Problem Relation Age of Onset  . Cancer Father    Social History:  reports that he has never smoked. He has never used smokeless tobacco. He reports that  drinks alcohol. He reports that he does not use illicit drugs.  Allergies: No Known Allergies   Current outpatient prescriptions: aspirin 325 MG EC tablet, Take 325 mg by mouth daily. , Disp: , Rfl: ;  HYDROcodone-acetaminophen (NORCO/VICODIN)  5-325 MG per tablet, Take 1 tablet by mouth every 6 (six) hours as needed for pain., Disp: , Rfl: ;  ibuprofen (ADVIL,MOTRIN) 200 MG tablet, Take 200 mg by mouth 2 (two) times daily as needed. For back pain. , Disp: , Rfl:  metFORMIN (GLUCOPHAGE) 500 MG tablet, Take 1,000 mg by mouth 2 (two) times daily with a meal. , Disp: , Rfl: ;  atorvastatin (LIPITOR) 10 MG tablet, Take 10 mg by mouth daily.  , Disp: , Rfl: ;  lisinopril (PRINIVIL,ZESTRIL) 40 MG tablet, Take 40 mg by mouth daily.  , Disp: , Rfl:    Review of Systems  Constitutional: Negative.   HENT: Negative.  Negative for neck pain.   Eyes: Negative.   Respiratory: Negative.   Cardiovascular: Negative.   Gastrointestinal: Negative.   Genitourinary: Negative.   Musculoskeletal: Positive for joint pain and falls. Negative for myalgias and back pain.       Right shoulder pain  Skin: Negative.   Neurological: Negative.   Endo/Heme/Allergies: Negative.   Psychiatric/Behavioral: Negative.    Vitals Weight: 150 lb Height: 67 in Body Surface Area: 1.79 m Body Mass Index: 23.49 kg/m Pulse: 61 (Regular) BP: 146/84 (Sitting, Left Arm, Standard)   Height 5\' 7"  (1.702 m), weight 68.04 kg (150 lb). Physical Exam  Constitutional: He is oriented to person, place, and time. He appears well-developed and well-nourished. No distress.  HENT:  Head: Normocephalic and atraumatic.  Right Ear: External ear normal.  Left Ear: External  ear normal.  Nose: Nose normal.  Mouth/Throat: Oropharynx is clear and moist.  Eyes: Conjunctivae and EOM are normal.  Neck: Normal range of motion. Neck supple. No thyromegaly present.  Cardiovascular: Normal rate, regular rhythm, normal heart sounds and intact distal pulses.   No murmur heard. Respiratory: Effort normal and breath sounds normal. No respiratory distress. He has no wheezes.  GI: Soft. Bowel sounds are normal. He exhibits no distension and no mass. There is no tenderness.   Musculoskeletal:       Right shoulder: He exhibits decreased range of motion, tenderness, pain and decreased strength.       Left shoulder: Normal.       Right elbow: Normal.      Left elbow: Normal.       Cervical back: Normal.  He has a full motion but it is a significantly painful arc of motion. Supraspinatus testing is severely painful with decreased strength at 4/5. The external and internal rotation at the waist are better with resisted external rotation about 4+/ 5 in pain.  Lymphadenopathy:    He has no cervical adenopathy.  Neurological: He is alert and oriented to person, place, and time. He has normal reflexes. No sensory deficit.  Skin: No rash noted. He is not diaphoretic. No erythema.  Psychiatric: He has a normal mood and affect. His behavior is normal.     Assessment/Plan Right shoulder rotator cuff tear with retraction He needs an open right shoulder rotator cuff repair with possible use of graft and anchors. We may or may not need to use a graft material that is made from calf skin. This is approved by the FDA and I have not had a rejection of that graft yet. Also, we may need to use anchors. These are polyethylene anchors that stay in the bone and we use those anchors to suture the tendon down in certain cases where the tendon is completely pulled off the bone. There is always a chance of a secondary infection obviously with any surgery but we do use antibiotics preop.  Eliyas Suddreth LAUREN 02/15/2013, 2:27 PM

## 2013-02-16 ENCOUNTER — Observation Stay (HOSPITAL_BASED_OUTPATIENT_CLINIC_OR_DEPARTMENT_OTHER)
Admission: RE | Admit: 2013-02-16 | Discharge: 2013-02-17 | Disposition: A | Discharge status: Home / Self Care | Payer: Medicare Other | Source: Ambulatory Visit | Attending: Orthopedic Surgery | Admitting: Orthopedic Surgery

## 2013-02-16 ENCOUNTER — Ambulatory Visit (HOSPITAL_BASED_OUTPATIENT_CLINIC_OR_DEPARTMENT_OTHER): Payer: Medicare Other | Admitting: Anesthesiology

## 2013-02-16 ENCOUNTER — Encounter (HOSPITAL_BASED_OUTPATIENT_CLINIC_OR_DEPARTMENT_OTHER): Payer: Self-pay | Admitting: Anesthesiology

## 2013-02-16 ENCOUNTER — Other Ambulatory Visit: Payer: Self-pay

## 2013-02-16 ENCOUNTER — Encounter (HOSPITAL_COMMUNITY)
Admission: RE | Disposition: A | Discharge status: Home / Self Care | Payer: Self-pay | Source: Ambulatory Visit | Attending: Orthopedic Surgery

## 2013-02-16 DIAGNOSIS — I10 Essential (primary) hypertension: Secondary | ICD-10-CM | POA: Insufficient documentation

## 2013-02-16 DIAGNOSIS — Z79899 Other long term (current) drug therapy: Secondary | ICD-10-CM | POA: Insufficient documentation

## 2013-02-16 DIAGNOSIS — E119 Type 2 diabetes mellitus without complications: Secondary | ICD-10-CM | POA: Insufficient documentation

## 2013-02-16 DIAGNOSIS — W010XXA Fall on same level from slipping, tripping and stumbling without subsequent striking against object, initial encounter: Secondary | ICD-10-CM | POA: Insufficient documentation

## 2013-02-16 DIAGNOSIS — S43429A Sprain of unspecified rotator cuff capsule, initial encounter: Secondary | ICD-10-CM

## 2013-02-16 DIAGNOSIS — X58XXXA Exposure to other specified factors, initial encounter: Secondary | ICD-10-CM | POA: Insufficient documentation

## 2013-02-16 DIAGNOSIS — Z9889 Other specified postprocedural states: Secondary | ICD-10-CM

## 2013-02-16 DIAGNOSIS — E785 Hyperlipidemia, unspecified: Secondary | ICD-10-CM | POA: Insufficient documentation

## 2013-02-16 DIAGNOSIS — M25819 Other specified joint disorders, unspecified shoulder: Principal | ICD-10-CM | POA: Insufficient documentation

## 2013-02-16 HISTORY — PX: SHOULDER OPEN ROTATOR CUFF REPAIR: SHX2407

## 2013-02-16 HISTORY — DX: Unspecified rotator cuff tear or rupture of right shoulder, not specified as traumatic: M75.101

## 2013-02-16 LAB — URINALYSIS, ROUTINE W REFLEX MICROSCOPIC
Bilirubin Urine: NEGATIVE
Glucose, UA: NEGATIVE mg/dL
Hgb urine dipstick: NEGATIVE
Ketones, ur: NEGATIVE mg/dL
Leukocytes, UA: NEGATIVE
Nitrite: NEGATIVE
Protein, ur: NEGATIVE mg/dL
Specific Gravity, Urine: 1.024 (ref 1.005–1.030)
Urobilinogen, UA: 0.2 mg/dL (ref 0.0–1.0)
pH: 5 (ref 5.0–8.0)

## 2013-02-16 LAB — COMPREHENSIVE METABOLIC PANEL
ALT: 17 U/L (ref 0–53)
AST: 18 U/L (ref 0–37)
Albumin: 3.8 g/dL (ref 3.5–5.2)
Alkaline Phosphatase: 62 U/L (ref 39–117)
BUN: 17 mg/dL (ref 6–23)
CO2: 26 mEq/L (ref 19–32)
Calcium: 9.3 mg/dL (ref 8.4–10.5)
Chloride: 101 mEq/L (ref 96–112)
Creatinine, Ser: 0.59 mg/dL (ref 0.50–1.35)
GFR calc Af Amer: >90 mL/min (ref >90)
GFR calc non Af Amer: >90 mL/min (ref >90)
Glucose, Bld: 144 mg/dL — ABNORMAL HIGH (ref 70–99)
Potassium: 4 mEq/L (ref 3.5–5.1)
Sodium: 137 mEq/L (ref 135–145)
Total Bilirubin: 0.5 mg/dL (ref 0.3–1.2)
Total Protein: 6.6 g/dL (ref 6.0–8.3)

## 2013-02-16 LAB — APTT: aPTT: 33 seconds (ref 24–37)

## 2013-02-16 LAB — PROTIME-INR
INR: 0.96 (ref 0.00–1.49)
Prothrombin Time: 12.7 seconds (ref 11.6–15.2)

## 2013-02-16 LAB — GLUCOSE, CAPILLARY: Glucose-Capillary: 122 mg/dL — ABNORMAL HIGH (ref 70–99)

## 2013-02-16 SURGERY — REPAIR, ROTATOR CUFF, OPEN
Anesthesia: General | Site: Shoulder | Laterality: Right | Wound class: Clean

## 2013-02-16 MED ORDER — ACETAMINOPHEN 10 MG/ML IV SOLN
1000.0000 mg | Freq: Once | INTRAVENOUS | Status: DC | PRN
  Filled 2013-02-16: qty 100

## 2013-02-16 MED ORDER — MIDAZOLAM HCL 5 MG/5ML IJ SOLN
INTRAMUSCULAR | Status: DC | PRN
  Administered 2013-02-16: 1 mg via INTRAVENOUS

## 2013-02-16 MED ORDER — ATORVASTATIN CALCIUM 10 MG PO TABS
10.0000 mg | ORAL_TABLET | Freq: Every day | ORAL | Status: DC
  Administered 2013-02-17: 10 mg via ORAL
  Filled 2013-02-16: qty 1

## 2013-02-16 MED ORDER — FENTANYL CITRATE 0.05 MG/ML IJ SOLN
INTRAMUSCULAR | Status: DC | PRN
  Administered 2013-02-16 (×4): 50 ug via INTRAVENOUS

## 2013-02-16 MED ORDER — ACETAMINOPHEN 325 MG PO TABS: 650.0000 mg | ORAL_TABLET | Freq: Four times a day (QID) | ORAL | Status: DC | PRN

## 2013-02-16 MED ORDER — METHOCARBAMOL 500 MG PO TABS: 500.0000 mg | ORAL_TABLET | Freq: Four times a day (QID) | ORAL | Status: DC | PRN

## 2013-02-16 MED ORDER — OXYCODONE-ACETAMINOPHEN 5-325 MG PO TABS
1.0000 | ORAL_TABLET | ORAL | Status: DC | PRN
  Administered 2013-02-16: 2 via ORAL
  Filled 2013-02-16: qty 2

## 2013-02-16 MED ORDER — LACTATED RINGERS IV SOLN
INTRAVENOUS | Status: DC
  Administered 2013-02-16: 20:00:00 via INTRAVENOUS

## 2013-02-16 MED ORDER — BISACODYL 10 MG RE SUPP: 10.0000 mg | Freq: Every day | RECTAL | Status: DC | PRN

## 2013-02-16 MED ORDER — FLEET ENEMA 7-19 GM/118ML RE ENEM: 1.0000 | ENEMA | Freq: Once | RECTAL | Status: AC | PRN

## 2013-02-16 MED ORDER — METHOCARBAMOL 100 MG/ML IJ SOLN: 500.0000 mg | Freq: Three times a day (TID) | INTRAVENOUS | Status: DC

## 2013-02-16 MED ORDER — PHENOL 1.4 % MT LIQD: 1.0000 | OROMUCOSAL | Status: DC | PRN

## 2013-02-16 MED ORDER — MENTHOL 3 MG MT LOZG
1.0000 | LOZENGE | OROMUCOSAL | Status: DC | PRN
  Filled 2013-02-16: qty 9

## 2013-02-16 MED ORDER — LACTATED RINGERS IV SOLN
INTRAVENOUS | Status: DC
  Administered 2013-02-16 (×2): via INTRAVENOUS
  Filled 2013-02-16: qty 1000

## 2013-02-16 MED ORDER — CHLORHEXIDINE GLUCONATE 4 % EX LIQD
60.0000 mL | Freq: Once | CUTANEOUS | Status: DC
  Filled 2013-02-16: qty 60

## 2013-02-16 MED ORDER — SODIUM CHLORIDE 0.9 % IR SOLN
Status: DC | PRN
  Administered 2013-02-16: 16:00:00

## 2013-02-16 MED ORDER — CEFAZOLIN SODIUM-DEXTROSE 2-3 GM-% IV SOLR
2.0000 g | INTRAVENOUS | Status: AC
  Administered 2013-02-16: 2 g via INTRAVENOUS
  Filled 2013-02-16: qty 50

## 2013-02-16 MED ORDER — ONDANSETRON HCL 4 MG/2ML IJ SOLN
INTRAMUSCULAR | Status: DC | PRN
  Administered 2013-02-16: 4 mg via INTRAVENOUS

## 2013-02-16 MED ORDER — OXYCODONE-ACETAMINOPHEN 5-325 MG PO TABS: 1.0000 | ORAL_TABLET | ORAL | Status: DC | PRN

## 2013-02-16 MED ORDER — PROPOFOL 10 MG/ML IV BOLUS
INTRAVENOUS | Status: DC | PRN
  Administered 2013-02-16: 200 mg via INTRAVENOUS

## 2013-02-16 MED ORDER — HYDROMORPHONE HCL PF 1 MG/ML IJ SOLN
0.5000 mg | INTRAMUSCULAR | Status: DC | PRN
  Administered 2013-02-16 – 2013-02-17 (×4): 1 mg via INTRAVENOUS
  Filled 2013-02-16 (×4): qty 1

## 2013-02-16 MED ORDER — BACITRACIN-NEOMYCIN-POLYMYXIN 400-5-5000 EX OINT: TOPICAL_OINTMENT | CUTANEOUS | Status: DC | PRN

## 2013-02-16 MED ORDER — DEXAMETHASONE SODIUM PHOSPHATE 4 MG/ML IJ SOLN
INTRAMUSCULAR | Status: DC | PRN
  Administered 2013-02-16: 4 mg via INTRAVENOUS

## 2013-02-16 MED ORDER — LISINOPRIL 40 MG PO TABS
40.0000 mg | ORAL_TABLET | Freq: Every day | ORAL | Status: DC
  Administered 2013-02-17: 40 mg via ORAL
  Filled 2013-02-16: qty 1

## 2013-02-16 MED ORDER — ACETAMINOPHEN 650 MG RE SUPP: 650.0000 mg | Freq: Four times a day (QID) | RECTAL | Status: DC | PRN

## 2013-02-16 MED ORDER — DEXTROSE 5 % IV SOLN
500.0000 mg | Freq: Three times a day (TID) | INTRAVENOUS | Status: DC
  Filled 2013-02-16 (×3): qty 5

## 2013-02-16 MED ORDER — EPHEDRINE SULFATE 50 MG/ML IJ SOLN
INTRAMUSCULAR | Status: DC | PRN
  Administered 2013-02-16: 5 mg via INTRAVENOUS
  Administered 2013-02-16 (×2): 10 mg via INTRAVENOUS

## 2013-02-16 MED ORDER — ACETAMINOPHEN 10 MG/ML IV SOLN
INTRAVENOUS | Status: DC | PRN
  Administered 2013-02-16: 1000 mg via INTRAVENOUS

## 2013-02-16 MED ORDER — HYDROMORPHONE HCL PF 1 MG/ML IJ SOLN
0.2500 mg | INTRAMUSCULAR | Status: DC | PRN
  Administered 2013-02-16 (×2): 0.25 mg via INTRAVENOUS
  Administered 2013-02-16: 0.5 mg via INTRAVENOUS
  Administered 2013-02-16 (×2): 0.25 mg via INTRAVENOUS
  Filled 2013-02-16: qty 1

## 2013-02-16 MED ORDER — OXYCODONE HCL 5 MG/5ML PO SOLN
5.0000 mg | Freq: Once | ORAL | Status: DC | PRN
  Filled 2013-02-16: qty 5

## 2013-02-16 MED ORDER — CEFAZOLIN SODIUM 1-5 GM-% IV SOLN
1.0000 g | Freq: Four times a day (QID) | INTRAVENOUS | Status: DC
  Administered 2013-02-16 – 2013-02-17 (×2): 1 g via INTRAVENOUS
  Filled 2013-02-16 (×3): qty 50

## 2013-02-16 MED ORDER — METHOCARBAMOL 100 MG/ML IJ SOLN
500.0000 mg | Freq: Four times a day (QID) | INTRAVENOUS | Status: DC | PRN
  Filled 2013-02-16: qty 5

## 2013-02-16 MED ORDER — INSULIN ASPART 100 UNIT/ML ~~LOC~~ SOLN: 0.0000 [IU] | Freq: Three times a day (TID) | SUBCUTANEOUS | Status: DC

## 2013-02-16 MED ORDER — BUPIVACAINE LIPOSOME 1.3 % IJ SUSP
INTRAMUSCULAR | Status: DC | PRN
  Administered 2013-02-16: 20 mL

## 2013-02-16 MED ORDER — MEPERIDINE HCL 25 MG/ML IJ SOLN
6.2500 mg | INTRAMUSCULAR | Status: DC | PRN
  Filled 2013-02-16: qty 1

## 2013-02-16 MED ORDER — METHOCARBAMOL 500 MG PO TABS
500.0000 mg | ORAL_TABLET | Freq: Three times a day (TID) | ORAL | Status: DC
  Administered 2013-02-17: 500 mg via ORAL
  Filled 2013-02-16 (×5): qty 1

## 2013-02-16 MED ORDER — LIDOCAINE HCL 4 % MT SOLN
OROMUCOSAL | Status: DC | PRN
  Administered 2013-02-16: 4 mL via TOPICAL

## 2013-02-16 MED ORDER — PROMETHAZINE HCL 25 MG/ML IJ SOLN
6.2500 mg | INTRAMUSCULAR | Status: DC | PRN
  Filled 2013-02-16: qty 1

## 2013-02-16 MED ORDER — OXYCODONE HCL 5 MG PO TABS
5.0000 mg | ORAL_TABLET | Freq: Once | ORAL | Status: DC | PRN
  Filled 2013-02-16: qty 1

## 2013-02-16 MED ORDER — HYDROCODONE-ACETAMINOPHEN 5-325 MG PO TABS
1.0000 | ORAL_TABLET | ORAL | Status: DC | PRN
  Administered 2013-02-17: 2 via ORAL
  Filled 2013-02-16: qty 2

## 2013-02-16 MED ORDER — POLYETHYLENE GLYCOL 3350 17 G PO PACK
17.0000 g | PACK | Freq: Every day | ORAL | Status: DC | PRN
  Filled 2013-02-16: qty 1

## 2013-02-16 MED ORDER — METOPROLOL TARTRATE 1 MG/ML IV SOLN
INTRAVENOUS | Status: DC | PRN
  Administered 2013-02-16: 1 mg via INTRAVENOUS

## 2013-02-16 MED ORDER — LIDOCAINE HCL (CARDIAC) 20 MG/ML IV SOLN
INTRAVENOUS | Status: DC | PRN
  Administered 2013-02-16: 60 mg via INTRAVENOUS

## 2013-02-16 MED ORDER — SUCCINYLCHOLINE CHLORIDE 20 MG/ML IJ SOLN
INTRAMUSCULAR | Status: DC | PRN
Start: 2013-02-16 — End: 2013-02-16
  Administered 2013-02-16: 120 mg via INTRAVENOUS

## 2013-02-16 MED ORDER — ONDANSETRON HCL 4 MG PO TABS: 4.0000 mg | ORAL_TABLET | Freq: Four times a day (QID) | ORAL | Status: DC | PRN

## 2013-02-16 MED ORDER — ONDANSETRON HCL 4 MG/2ML IJ SOLN: 4.0000 mg | Freq: Four times a day (QID) | INTRAMUSCULAR | Status: DC | PRN

## 2013-02-16 MED ORDER — METHOCARBAMOL 500 MG PO TABS
500.0000 mg | ORAL_TABLET | Freq: Four times a day (QID) | ORAL | Status: DC | PRN
  Administered 2013-02-16: 500 mg via ORAL
  Filled 2013-02-16 (×3): qty 1

## 2013-02-16 MED ORDER — GLYCOPYRROLATE 0.2 MG/ML IJ SOLN
INTRAMUSCULAR | Status: DC | PRN
  Administered 2013-02-16: 0.2 mg via INTRAVENOUS

## 2013-02-16 MED ORDER — METFORMIN HCL 500 MG PO TABS
1000.0000 mg | ORAL_TABLET | Freq: Two times a day (BID) | ORAL | Status: DC
  Administered 2013-02-17: 1000 mg via ORAL
  Filled 2013-02-16 (×4): qty 2

## 2013-02-16 SURGICAL SUPPLY — 51 items
ANCHOR PEEK ZIP 5.5 NDL NO2 (Orthopedic Implant) ×6 IMPLANT
BLADE FLAT COURSE (BLADE) IMPLANT
BNDG COHESIVE 6X5 TAN NS LF (GAUZE/BANDAGES/DRESSINGS) ×2 IMPLANT
CANISTER SUCTION 2500CC (MISCELLANEOUS) ×2 IMPLANT
CLEANER CAUTERY TIP 5X5 PAD (MISCELLANEOUS) ×1 IMPLANT
CLOTH BEACON ORANGE TIMEOUT ST (SAFETY) ×2 IMPLANT
DRAPE INCISE IOBAN 66X45 STRL (DRAPES) IMPLANT
DRAPE POUCH INSTRU U-SHP 10X18 (DRAPES) ×2 IMPLANT
DRSG EMULSION OIL 3X3 NADH (GAUZE/BANDAGES/DRESSINGS) ×2 IMPLANT
DRSG PAD ABDOMINAL 8X10 ST (GAUZE/BANDAGES/DRESSINGS) ×2 IMPLANT
DURAPREP 26ML APPLICATOR (WOUND CARE) ×2 IMPLANT
ELECT REM PT RETURN 9FT ADLT (ELECTROSURGICAL) ×2
ELECTRODE REM PT RTRN 9FT ADLT (ELECTROSURGICAL) ×1 IMPLANT
GLOVE BIO SURGEON STRL SZ 6.5 (GLOVE) ×6 IMPLANT
GLOVE BIO SURGEON STRL SZ7.5 (GLOVE) ×2 IMPLANT
GLOVE BIO SURGEON STRL SZ8 (GLOVE) IMPLANT
GLOVE BIOGEL PI IND STRL 7.0 (GLOVE) ×1 IMPLANT
GLOVE BIOGEL PI IND STRL 8.5 (GLOVE) ×1 IMPLANT
GLOVE BIOGEL PI INDICATOR 7.0 (GLOVE) ×1
GLOVE BIOGEL PI INDICATOR 8.5 (GLOVE) ×1
GLOVE ECLIPSE 7.0 STRL STRAW (GLOVE) ×2 IMPLANT
GLOVE SURG SS PI 6.5 STRL IVOR (GLOVE) ×4 IMPLANT
GOWN PREVENTION PLUS LG XLONG (DISPOSABLE) ×6 IMPLANT
NS IRRIG 500ML POUR BTL (IV SOLUTION) ×2 IMPLANT
PACK BASIN DAY SURGERY FS (CUSTOM PROCEDURE TRAY) ×2 IMPLANT
PACK SHOULDER CUSTOM OPM052 (CUSTOM PROCEDURE TRAY) ×4 IMPLANT
PAD CLEANER CAUTERY TIP 5X5 (MISCELLANEOUS) ×1
PASSER SUT SWANSON 36MM LOOP (INSTRUMENTS) IMPLANT
PATCH TISSUE MEND 3X3CM (Orthopedic Implant) ×2 IMPLANT
SLING ARM FOAM STRAP LRG (SOFTGOODS) IMPLANT
SLING ARM IMMOBILIZER LRG (SOFTGOODS) ×2 IMPLANT
SPONGE GAUZE 4X4 12PLY (GAUZE/BANDAGES/DRESSINGS) ×2 IMPLANT
SPONGE LAP 4X18 X RAY DECT (DISPOSABLE) IMPLANT
SPONGE SURGIFOAM ABS GEL 100 (HEMOSTASIS) IMPLANT
STAPLER VISISTAT 35W (STAPLE) ×2 IMPLANT
STOCKINETTE IMPERVIOUS LG (DRAPES) IMPLANT
STRIP CLOSURE SKIN 1/2X4 (GAUZE/BANDAGES/DRESSINGS) ×2 IMPLANT
SUCTION FRAZIER TIP 10 FR DISP (SUCTIONS) IMPLANT
SUT BONE WAX W31G (SUTURE) IMPLANT
SUT ETHIBOND NAB CT1 #1 30IN (SUTURE) ×4 IMPLANT
SUT MNCRL AB 4-0 PS2 18 (SUTURE) ×2 IMPLANT
SUT VIC AB 0 CT1 36 (SUTURE) ×2 IMPLANT
SUT VIC AB 1 CT1 27 (SUTURE) ×2
SUT VIC AB 1 CT1 27XBRD ANTBC (SUTURE) ×2 IMPLANT
SUT VIC AB 1 CT1 36 (SUTURE) ×2 IMPLANT
SUT VIC AB 2-0 CT1 27 (SUTURE) ×1
SUT VIC AB 2-0 CT1 27XBRD (SUTURE) IMPLANT
SUT VIC AB 2-0 CT1 TAPERPNT 27 (SUTURE) ×1 IMPLANT
TAPE HYPAFIX 6X30 (GAUZE/BANDAGES/DRESSINGS) ×2 IMPLANT
TOWEL OR 17X24 6PK STRL BLUE (TOWEL DISPOSABLE) ×4 IMPLANT
WATER STERILE IRR 500ML POUR (IV SOLUTION) ×2 IMPLANT

## 2013-02-16 NOTE — Progress Notes (Signed)
Report given to [facility] on [phone]

## 2013-02-16 NOTE — Anesthesia Preprocedure Evaluation (Addendum)
Anesthesia Evaluation  Patient identified by MRN, date of birth, ID band Patient awake    Reviewed: Allergy & Precautions, H&P , NPO status , Patient's Chart, lab work & pertinent test results  Airway Mallampati: I TM Distance: >3 FB Neck ROM: full    Dental  (+) Teeth Intact, Caps and Dental Advisory Given   Pulmonary neg pulmonary ROS,    Pulmonary exam normal       Cardiovascular Exercise Tolerance: Good hypertension, Pt. on medications Rhythm:regular Rate:Normal     Neuro/Psych PSYCHIATRIC DISORDERS Anxiety    GI/Hepatic negative GI ROS, Neg liver ROS,   Endo/Other  diabetes, Type 2, Oral Hypoglycemic Agents  Renal/GU negative Renal ROS     Musculoskeletal   Abdominal   Peds  Hematology   Anesthesia Other Findings   Reproductive/Obstetrics                           Anesthesia Physical  Anesthesia Plan  ASA: II  Anesthesia Plan: General   Post-op Pain Management:    Induction: Intravenous  Airway Management Planned: Oral ETT  Additional Equipment:   Intra-op Plan:   Post-operative Plan: Extubation in OR  Informed Consent: I have reviewed the patients History and Physical, chart, labs and discussed the procedure including the risks, benefits and alternatives for the proposed anesthesia with the patient or authorized representative who has indicated his/her understanding and acceptance.   Dental advisory given  Plan Discussed with: CRNA  Anesthesia Plan Comments:         Anesthesia Quick Evaluation

## 2013-02-16 NOTE — Transfer of Care (Signed)
Immediate Anesthesia Transfer of Care Note  Patient: Daniel Taylor  Procedure(s) Performed: Procedure(s) (LRB): OPEN RIGHT SHOULDER ROTATOR CUFF REPAIR WITH GRAFT AND ANCHORS (Right)  Patient Location: PACU  Anesthesia Type: General  Level of Consciousness: awake, alert  and oriented  Airway & Oxygen Therapy: Patient Spontanous Breathing and Patient connected to face mask oxygen  Post-op Assessment: Report given to PACU RN and Post -op Vital signs reviewed and stable  Post vital signs: Reviewed and stable  Complications: No apparent anesthesia complications

## 2013-02-16 NOTE — Anesthesia Postprocedure Evaluation (Signed)
  Anesthesia Post-op Note  Patient: Daniel Taylor  Procedure(s) Performed: Procedure(s) (LRB): OPEN RIGHT SHOULDER ROTATOR CUFF REPAIR WITH GRAFT AND ANCHORS (Right)  Patient Location: PACU  Anesthesia Type: General  Level of Consciousness: awake and alert   Airway and Oxygen Therapy: Patient Spontanous Breathing  Post-op Pain: mild  Post-op Assessment: Post-op Vital signs reviewed, Patient's Cardiovascular Status Stable, Respiratory Function Stable, Patent Airway and No signs of Nausea or vomiting  Last Vitals:  Filed Vitals:   02/16/13 2006  BP: 166/83  Pulse: 67  Temp: 36.5 C  Resp: 16    Post-op Vital Signs: stable   Complications: No apparent anesthesia complications. Plan is to admit for overnight observation.

## 2013-02-16 NOTE — Brief Op Note (Signed)
02/16/2013  4:31 PM  PATIENT:  Daniel Taylor  67 y.o. male  PRE-OPERATIVE DIAGNOSIS:  RIGHT SHOULDER ROTATOR CUFF TEAR,Complex,Complete,and Retracted/  POST-OPERATIVE DIAGNOSIS:  RIGHT SHOULDER ROTATOR CUFF TEAR,Complex,Complete and Retracted.  PROCEDURE:  Procedure(s): OPEN RIGHT SHOULDER ROTATOR CUFF REPAIR WITH GRAFT AND ANCHORS (Right),for a Complex, Complete retracted Tear and Open Acromionectomy.  SURGEON:  Surgeon(s) and Role:    * Jacki Cones, MD - Primary  PHYSICIAN ASSISTANT:Amber [place] PA  ASSISTANTS: Dimitri Ped PA  ANESTHESIA:   general  EBL:  Total I/O In: 1000 [I.V.:1000] Out: -   BLOOD ADMINISTERED:none  DRAINS: none   LOCAL MEDICATIONS USED:  BUPIVICAINE 20cc.  SPECIMEN:  No Specimen  DISPOSITION OF SPECIMEN:  N/A  COUNTS:  YES  TOURNIQUET:  * No tourniquets in log *  DICTATION: .Other Dictation: Dictation Number [id number]  PLAN OF CARE: Admit for overnight observation  PATIENT DISPOSITION:  Stable in OR   Delay start of Pharmacological VTE agent (>24hrs) due to surgical blood loss or risk of bleeding: yes

## 2013-02-16 NOTE — Anesthesia Procedure Notes (Signed)
Procedure Name: Intubation Date/Time: 02/16/2013 3:14 PM Performed by: Norva Pavlov Pre-anesthesia Checklist: Patient identified, Emergency Drugs available, Suction available and Patient being monitored Patient Re-evaluated:Patient Re-evaluated prior to inductionOxygen Delivery Method: Circle System Utilized Preoxygenation: Pre-oxygenation with 100% oxygen Intubation Type: IV induction Ventilation: Mask ventilation without difficulty Laryngoscope Size: Mac and 3 Tube type: Oral Tube size: 8.0 mm Number of attempts: 1 Airway Equipment and Method: stylet and LTA kit utilized Placement Confirmation: ETT inserted through vocal cords under direct vision,  positive ETCO2 and breath sounds checked- equal and bilateral Secured at: 22 cm Tube secured with: Tape Dental Injury: Teeth and Oropharynx as per pre-operative assessment

## 2013-02-16 NOTE — Interval H&P Note (Signed)
History and Physical Interval Note:  02/16/2013 3:06 PM  Daniel Taylor  has presented today for surgery, with the diagnosis of RIGHT SHOULDER ROTATOR CUFF TEAR  The various methods of treatment have been discussed with the patient and family. After consideration of risks, benefits and other options for treatment, the patient has consented to  Procedure(s): OPEN RIGHT SHOULDER ROTATOR CUFF REPAIR WITH POSSIBLE GRAFT AND ANCHORS (Right) as a surgical intervention .  The patient's history has been reviewed, patient examined, no change in status, stable for surgery.  I have reviewed the patient's chart and labs.  Questions were answered to the patient's satisfaction.     [name] A

## 2013-02-17 ENCOUNTER — Encounter (HOSPITAL_BASED_OUTPATIENT_CLINIC_OR_DEPARTMENT_OTHER): Payer: Self-pay | Admitting: Orthopedic Surgery

## 2013-02-17 LAB — POCT HEMOGLOBIN-HEMACUE: Hemoglobin: 12.5 g/dL — ABNORMAL LOW (ref 13.0–17.0)

## 2013-02-17 LAB — GLUCOSE, CAPILLARY: Glucose-Capillary: 133 mg/dL — ABNORMAL HIGH (ref 70–99)

## 2013-02-17 MED ORDER — HYDROMORPHONE HCL 2 MG PO TABS: 2.0000 mg | ORAL_TABLET | ORAL | Status: DC | PRN

## 2013-02-17 MED ORDER — METHOCARBAMOL 500 MG PO TABS: 500.0000 mg | ORAL_TABLET | Freq: Four times a day (QID) | ORAL | Status: DC | PRN

## 2013-02-17 MED ORDER — HYDROMORPHONE HCL 2 MG PO TABS
2.0000 mg | ORAL_TABLET | ORAL | Status: DC | PRN
  Administered 2013-02-17 (×2): 2 mg via ORAL
  Filled 2013-02-17 (×2): qty 1

## 2013-02-17 NOTE — Op Note (Signed)
NAMETHEODOROS, STJAMES NO.:  [id number]  MEDICAL RECORD NO.:  [id number]  LOCATION:  1528                         FACILITY:  [facility]  PHYSICIAN:  Georges Lynch. Ashyra Cantin, M.D.DATE OF BIRTH:  05/13/1946  DATE OF PROCEDURE:  02/16/2013 DATE OF DISCHARGE:                              OPERATIVE REPORT   SURGEON:  Georges Lynch. Darrelyn Hillock, M.D.  OPERATIVE ASSISTANT:  Dimitri Ped, PA  PREOPERATIVE DIAGNOSIS: 1. Severe impingement syndrome, right shoulder. 2. Complete complex retracted tear of the right rotator cuff tendon.  POSTOPERATIVE DIAGNOSIS: 1. Severe impingement syndrome, right shoulder. 2. Complete complex retracted tear of the right rotator cuff tendon.  OPERATION: 1. Open acromionectomy right shoulder. 2. Repair of a complete complex retracted tear of the right rotator     cuff tendon. 3. We utilize 3 Stryker anchors. 4. We utilized a small TissueMend graft for reinforcement of the     repair.  PROCEDURE IN DETAIL:  Under general anesthesia, the patient in the upright beach chair position, a routine orthopedic prep and draping in the right shoulder was carried out.  The patient had 2 g of IV Ancef preop.  Note, the appropriate time-out was carried out before we made an incision.  I also marked the appropriate right arm in the holding area. Following that, an incision was made over the anterior aspect of the right shoulder.  I identified the acromion, I stripped the deltoid tendon by sharp dissection.  I split a small proximal part of the deltoid muscle.  At this time, immediately upon going down into the shoulder, a large amount of fluid came out of the joint, which was indicative of complete tear of the cuff.  Following that, we protected the underlying cuff.  We utilized a Ship broker and I utilized the oscillating saw and did a partial acromionectomy and acromioplasty of the acromion.  I then thoroughly irrigated out the area and bone waxed the  undersurface of the acromion.  We then curetted out the lateral articular cartilage of the humerus until we had good bleeding bone.  I then inserted 3 anchors in the proximal humerus.  The tendon was brought forward that was retracted, it was retracted medially and laterally and proximally.  I did a primary repair of the initial part with Ethibond suture.  I then brought the tendon forward and sewed it down in place over the bleeding bone utilizing the anchor sutures.  Note, we did do a subdeltoid bursectomy as well.  We did re-established the subacromial space.  Following that, we then applied a 3 x 3 TissueMend graft to the area and reinforced it with the 3rd anchor.  At this time, we irrigated the wound and closed the wound layers in usual fashion.  We did a Monocryl subcutaneous closure.  Sterile dressings were applied.  The patient was placed in a shoulder immobilizer.  Prior to the closure we injected 20 mL of Exparel.          ______________________________ Georges Lynch. Darrelyn Hillock, M.D.     RAG/MEDQ  D:  02/16/2013  T:  02/17/2013  Job:  [id number]

## 2013-02-17 NOTE — Evaluation (Signed)
Occupational Therapy Evaluation Patient Details Name: Daniel Taylor MRN: [id number] DOB: [birth year] Today's Date: 02/17/2013 Time: 4098-1191 OT Time Calculation (min): 34 min  OT Assessment / Plan / Recommendation Clinical Impression  Pt is s/p R shoulder surgery. All education completed with pt and he will have wife available to assist. He feels he can direct wife how to help care for shoulder with bathing, dressing etc.     OT Assessment  Patient does not need any further OT services;Progress rehab of shoulder as ordered by MD at follow-up appointment    Follow Up Recommendations  No OT follow up    Barriers to Discharge      Equipment Recommendations  None recommended by OT    Recommendations for Other Services    Frequency       Precautions / Restrictions Precautions Precautions: Shoulder Precaution Booklet Issued: Yes (comment) Precaution Comments: Issued shoulder care handout and reviewed all with pt Required Braces or Orthoses: Other Brace/Splint Other Brace/Splint: R shoulder immobilizer Restrictions Weight Bearing Restrictions: Yes RUE Weight Bearing: Non weight bearing        ADL  ADL Comments: Overall pt mod assist for UE ADL bathing and dressing.     OT Diagnosis:    OT Problem List:   OT Treatment Interventions:     OT Goals    Visit Information  Last OT Received On: 02/17/13 Assistance Needed: +1    Subjective Data  Subjective: I am doing ok Patient Stated Goal: none stated. pt agreeable to work with OT   Prior Functioning     Home Living Lives With: Spouse Available Help at Discharge: Family Communication Communication: No difficulties Dominant Hand: Right         Vision/Perception     Cognition       Extremity/Trunk Assessment       Mobility       Exercise Other Exercises Other Exercises: elbow, wrist and hand ROM (flexion and extension X10) Donning/doffing shirt without moving shoulder: Patient able to independently  direct caregiver Method for sponge bathing under operated UE: Patient able to independently direct caregiver Donning/doffing sling/immobilizer: Patient able to independently direct caregiver Correct positioning of sling/immobilizer: Independent;Patient able to independently direct caregiver ROM for elbow, wrist and digits of operated UE: Supervision/safety Sling wearing schedule (on at all times/off for ADL's): Independent;Patient able to independently direct caregiver Proper positioning of operated UE when showering: Independent;Patient able to independently direct caregiver Dressing change: Patient able to independently direct caregiver Positioning of UE while sleeping: Independent;Patient able to independently direct caregiver   Balance     End of Session OT - End of Session Activity Tolerance: Patient tolerated treatment well Patient left: in chair;with call bell/phone within reach  GO Functional Assessment Tool Used: clinical judgement Functional Limitation: Self care Self Care Current Status (Y7829): At least 20 percent but less than 40 percent impaired, limited or restricted Self Care Goal Status (F6213): At least 20 percent but less than 40 percent impaired, limited or restricted Self Care Discharge Status [phone]): At least 20 percent but less than 40 percent impaired, limited or restricted   Lennox Laity [phone] 02/17/2013, 10:01 AM

## 2013-02-17 NOTE — Progress Notes (Signed)
   Subjective: 1 Day Post-Op Procedure(s) (LRB): OPEN RIGHT SHOULDER ROTATOR CUFF REPAIR WITH GRAFT AND ANCHORS (Right) Patient reports pain as moderate.   Patient seen in rounds without Dr. Darrelyn Hillock. Patient is well, but has had some minor complaints of pain in the right shoulder, requiring pain medications. He says that currently he is only able to get pain relief with the IV meds. Does not feel that Percocet is working. No complaints of SOB or chest pain. No issues overnight.  Plan is to go Home after hospital stay.  Objective: Vital signs in last 24 hours: Temp:  [97.4 F (36.3 C)-98.6 F (37 C)] 97.8 F (36.6 C) (05/08 0700) Pulse Rate:  [58-86] 79 (05/08 0700) Resp:  [14-20] 16 (05/08 0700) BP: (139-172)/(65-96) 172/83 mmHg (05/08 0700) SpO2:  [91 %-99 %] 96 % (05/08 0700) Weight:  [66.821 kg (147 lb 5 oz)] 66.821 kg (147 lb 5 oz) (05/07 1258)  Intake/Output from previous day:  Intake/Output Summary (Last 24 hours) at 02/17/13 0730 Last data filed at 02/17/13 0600  Gross per 24 hour  Intake 3185.33 ml  Output      0 ml  Net 3185.33 ml     Recent Labs  02/16/13 1345  NA 137  K 4.0  CL 101  CO2 26  BUN 17  CREATININE 0.59  GLUCOSE 144*  CALCIUM 9.3    Recent Labs  02/16/13 1345  INR 0.96    EXAM General - Patient is Alert and Oriented Extremity - Neurologically intact Neurovascular intact Dressing/Incision - clean, dry Motor Function - intact, moving hand and fingers well on exam.   Past Medical History  Diagnosis Date  . Diabetes mellitus   . Hyperlipidemia   . Anxiety   . Hypertension     pcp  dr Tenny Craw   @ [facility]  . Right rotator cuff tear     Assessment/Plan: 1 Day Post-Op Procedure(s) (LRB): OPEN RIGHT SHOULDER ROTATOR CUFF REPAIR WITH GRAFT AND ANCHORS (Right) Active Problems:   Rotator cuff (capsule) sprain  Estimated body mass index is 23.07 kg/(m^2) as calculated from the following:   Height as of this encounter: 5\' 7"  (1.702 m).   Weight as of this encounter: 66.821 kg (147 lb 5 oz). Advance diet D/C IV fluids Discharge home   Patient is doing ok this morning. He reports that he is having some trouble with pain control in the right shoulder. Will switch to Dilaudid from Percocet. Will discharge this afternoon after pain under better control. Will follow up in the office in 10-12 days. Discharge instructions discussed with patient.   Lavaris Sexson LAUREN 02/17/2013, 7:30 AM

## 2013-02-17 NOTE — Care Management Note (Signed)
    Page 1 of 1   02/17/2013     11:51:05 AM   CARE MANAGEMENT NOTE 02/17/2013  Patient:  Daniel Taylor, Daniel Taylor   Account Number:  [id number]  Date Initiated:  02/17/2013  Documentation initiated by:  Lorenda Ishihara  Subjective/Objective Assessment:   67 yo male admitted s/p rotator cuff repair. PTA lived at home with spouse     Action/Plan:   Home when stable   Anticipated DC Date:  02/17/2013   Anticipated DC Plan:  HOME/SELF CARE      DC Planning Services  CM consult      Choice offered to / List presented to:             Status of service:  Completed, signed off Medicare Important Message given?   (If response is "NO", the following Medicare IM given date fields will be blank) Date Medicare IM given:   Date Additional Medicare IM given:    Discharge Disposition:  HOME/SELF CARE  Per UR Regulation:  Reviewed for med. necessity/level of care/duration of stay  If discussed at Long Length of Stay Meetings, dates discussed:    Comments:

## 2013-02-17 NOTE — Progress Notes (Signed)
Pt discharged home via family; Pt and family given and explained all discharge instructions, carenotes, and prescriptions; pt and family stated understanding and denied questions/concerns; all f/u appointments in place; IV removed without complicaitons; pt stable at time of discharge  

## 2013-02-22 NOTE — Discharge Summary (Signed)
Physician Discharge Summary   Patient ID: Daniel Taylor MRN: [id number] DOB/AGE: [birth year] 66 y.o.  Admit date: 02/16/2013 Discharge date: 02/17/2013  Primary Diagnosis: Right shoulder rotator cuff tear  Admission Diagnoses:  Past Medical History  Diagnosis Date  . Diabetes mellitus   . Hyperlipidemia   . Anxiety   . Hypertension     pcp  dr Tenny Craw   @ [facility]  . Right rotator cuff tear    Discharge Diagnoses:   Active Problems:   Rotator cuff (capsule) sprain  Estimated body mass index is 23.07 kg/(m^2) as calculated from the following:   Height as of this encounter: 5\' 7"  (1.702 m).   Weight as of this encounter: 66.821 kg (147 lb 5 oz).  Procedure:  Procedure(s) (LRB): OPEN RIGHT SHOULDER ROTATOR CUFF REPAIR WITH GRAFT AND ANCHORS (Right)   Consults: None  HPI: The patient is a 67 year old male who presented with shoulder complaints. They report pain and popping at the right anterior shoulder that began 6 weeks ago. The patient reports that the shoulder symptoms began following a specific injury in which the patient states he fell on the ice and caught himself with his left hand. The onset of symptoms was sudden. The patient reports symptoms which include shoulder pain and popping, while reported symptoms do not include clicking, crepitation, catching or locking. The patient reports that these symptoms are present constantly. The patient describes these symptoms as mild to severe and unchanged. Symptoms are exacerbated by motion at the shoulder. Current treatment includes nonsteroidal anti-inflammatory drugs (Ibuprofen). He had an MRI of the right shoulder that showed a large full thickness supraspinatus tear with tendon retraction.   Laboratory Data: Admission on 02/16/2013, Discharged on 02/17/2013  Component Date Value Range Status  . Sodium 02/16/2013 137  135 - 145 mEq/L Final  . Potassium 02/16/2013 4.0  3.5 - 5.1 mEq/L Final  . Chloride 02/16/2013 101  96 - 112 mEq/L  Final  . CO2 02/16/2013 26  19 - 32 mEq/L Final  . Glucose, Bld 02/16/2013 144* 70 - 99 mg/dL Final  . BUN 40/98/1191 17  6 - 23 mg/dL Final  . Creatinine, Ser 02/16/2013 0.59  0.50 - 1.35 mg/dL Final  . Calcium 47/82/9562 9.3  8.4 - 10.5 mg/dL Final  . Total Protein 02/16/2013 6.6  6.0 - 8.3 g/dL Final  . Albumin 13/05/6577 3.8  3.5 - 5.2 g/dL Final  . AST 46/96/2952 18  0 - 37 U/L Final  . ALT 02/16/2013 17  0 - 53 U/L Final  . Alkaline Phosphatase 02/16/2013 62  39 - 117 U/L Final  . Total Bilirubin 02/16/2013 0.5  0.3 - 1.2 mg/dL Final  . GFR calc non Af Amer 02/16/2013 >90  >90 mL/min Final  . GFR calc Af Amer 02/16/2013 >90  >90 mL/min Final   Comment:                                 The eGFR has been calculated                          using the CKD EPI equation.                          This calculation has not been  validated in all clinical                          situations.                          eGFR's persistently                          <90 mL/min signify                          possible Chronic Kidney Disease.  Marland Kitchen Prothrombin Time 02/16/2013 12.7  11.6 - 15.2 seconds Final  . INR 02/16/2013 0.96  0.00 - 1.49 Final  . aPTT 02/16/2013 33  24 - 37 seconds Final  . Color, Urine 02/16/2013 YELLOW  YELLOW Final  . APPearance 02/16/2013 CLEAR  CLEAR Final  . Specific Gravity, Urine 02/16/2013 1.024  1.005 - 1.030 Final  . pH 02/16/2013 5.0  5.0 - 8.0 Final  . Glucose, UA 02/16/2013 NEGATIVE  NEGATIVE mg/dL Final  . Hgb urine dipstick 02/16/2013 NEGATIVE  NEGATIVE Final  . Bilirubin Urine 02/16/2013 NEGATIVE  NEGATIVE Final  . Ketones, ur 02/16/2013 NEGATIVE  NEGATIVE mg/dL Final  . Protein, ur 16/07/9603 NEGATIVE  NEGATIVE mg/dL Final  . Urobilinogen, UA 02/16/2013 0.2  0.0 - 1.0 mg/dL Final  . Nitrite 54/06/8118 NEGATIVE  NEGATIVE Final  . Leukocytes, UA 02/16/2013 NEGATIVE  NEGATIVE Final   MICROSCOPIC NOT DONE ON URINES WITH NEGATIVE  PROTEIN, BLOOD, LEUKOCYTES, NITRITE, OR GLUCOSE <1000 mg/dL.  Marland Kitchen Glucose-Capillary 02/16/2013 122* 70 - 99 mg/dL Final  . Glucose-Capillary 02/16/2013 198* 70 - 99 mg/dL Final  . Glucose-Capillary 02/17/2013 133* 70 - 99 mg/dL Final  . Glucose-Capillary 02/16/2013 194* 70 - 99 mg/dL Final  . Hemoglobin 14/78/2956 12.5* 13.0 - 17.0 g/dL Final       EKG: Orders placed during the hospital encounter of 02/16/13  . EKG     Hospital Course: CAESON FILIPPI is a 67 y.o. who was admitted to [facility]. They were brought to the operating room on 02/16/2013 and underwent Procedure(s): OPEN RIGHT SHOULDER ROTATOR CUFF REPAIR WITH GRAFT AND ANCHORS.  Patient tolerated the procedure well and was later transferred to the recovery room and then to the orthopaedic floor for postoperative care.  They were given PO and IV analgesics for pain control following their surgery.  They were given 24 hours of postoperative antibiotics of  Anti-infectives   Start     Dose/Rate Route Frequency Ordered Stop   02/17/13 0600  ceFAZolin (ANCEF) IVPB 2 g/50 mL premix     2 g 100 mL/hr over 30 Minutes Intravenous On call to O.R. 02/16/13 1313 02/17/13 0630   02/16/13 2200  ceFAZolin (ANCEF) IVPB 1 g/50 mL premix  Status:  Discontinued     1 g 100 mL/hr over 30 Minutes Intravenous Every 6 hours 02/16/13 1945 02/17/13 1511   02/16/13 1533  polymyxin B 500,000 Units, bacitracin 50,000 Units in sodium chloride irrigation 0.9 % 500 mL irrigation  Status:  Discontinued       As needed 02/16/13 1535 02/16/13 1701    OT was ordered for instruction on sling management.  Discharge planning consulted to help with postop disposition and equipment needs.  Patient had a decent night on the evening of surgery, having some trouble with pain control.   Patient  was seen in rounds and was ready to go home.   Discharge Medications: Prior to Admission medications   Medication Sig Start Date End Date Taking? Authorizing Provider    aspirin 325 MG EC tablet Take 325 mg by mouth daily.    Yes Historical Provider, MD  atorvastatin (LIPITOR) 10 MG tablet Take 10 mg by mouth daily.     Yes Historical Provider, MD  ibuprofen (ADVIL,MOTRIN) 200 MG tablet Take 200 mg by mouth 2 (two) times daily as needed. For back pain.    Yes Historical Provider, MD  lisinopril (PRINIVIL,ZESTRIL) 40 MG tablet Take 40 mg by mouth daily.     Yes Historical Provider, MD  metFORMIN (GLUCOPHAGE) 500 MG tablet Take 1,000 mg by mouth 2 (two) times daily with a meal.    Yes Historical Provider, MD  HYDROmorphone (DILAUDID) 2 MG tablet Take 1 tablet (2 mg total) by mouth every 4 (four) hours as needed. 02/17/13   Anjelica Gorniak Tamala Ser, PA-C  methocarbamol (ROBAXIN) 500 MG tablet Take 1 tablet (500 mg total) by mouth every 6 (six) hours as needed. 02/17/13   Raelea Gosse Tamala Ser, PA-C    Diet: Diabetic diet Activity:Wear sling at all times Follow-up:in 10-12 days Disposition - Home Discharged Condition: good   Discharge Orders   Future Orders Complete By Expires     Call MD / Call 911  As directed     Comments:      If you experience chest pain or shortness of breath, CALL 911 and be transported to the hospital emergency room.  If you develope a fever above 101 F, pus (white drainage) or increased drainage or redness at the wound, or calf pain, call your surgeon's office.    Constipation Prevention  As directed     Comments:      Drink plenty of fluids.  Prune juice may be helpful.  You may use a stool softener, such as Colace (over the counter) 100 mg twice a day.  Use MiraLax (over the counter) for constipation as needed.    Discharge instructions  As directed     Comments:      Keep your sling on at all times, including sleeping in your sling.  The only time you should remove your sling is to shower only but you need to keep your hand against your chest while you shower.   For the first few days, remove your dressing, tape a piece of saran  wrap over your incision, take your shower, then remove the saran wrap and put a clean dressing on, then reapply your sling.  After two days you can shower without the saran wrap.   Call Dr. Darrelyn Hillock if any wound complications or temperature of 101 degrees F or over.   Call the office for an appointment to see Dr. Darrelyn Hillock in two weeks: [phone] and ask for Dr. Jeannetta Ellis nurse, Mackey [place]ch[place].    Driving restrictions  As directed     Comments:      No driving    Lifting restrictions  As directed     Comments:      No lifting        Medication List    STOP taking these medications       HYDROcodone-acetaminophen 5-325 MG per tablet  Commonly known as:  NORCO/VICODIN      TAKE these medications       aspirin 325 MG EC tablet  Take 325 mg by mouth daily.  atorvastatin 10 MG tablet  Commonly known as:  LIPITOR  Take 10 mg by mouth daily.     HYDROmorphone 2 MG tablet  Commonly known as:  DILAUDID  Take 1 tablet (2 mg total) by mouth every 4 (four) hours as needed.     ibuprofen 200 MG tablet  Commonly known as:  ADVIL,MOTRIN  Take 200 mg by mouth 2 (two) times daily as needed. For back pain.     lisinopril 40 MG tablet  Commonly known as:  PRINIVIL,ZESTRIL  Take 40 mg by mouth daily.     metFORMIN 500 MG tablet  Commonly known as:  GLUCOPHAGE  Take 1,000 mg by mouth 2 (two) times daily with a meal.     methocarbamol 500 MG tablet  Commonly known as:  ROBAXIN  Take 1 tablet (500 mg total) by mouth every 6 (six) hours as needed.           Follow-up Information   Follow up with GIOFFRE,RONALD A, MD. Schedule an appointment as soon as possible for a visit in 10 days.   Contact information:   [address], [place] [address] [place] [address] [phone]       Signed: Kerby Nora 02/22/2013, 8:45 AM

## 2013-03-10 ENCOUNTER — Other Ambulatory Visit: Payer: Self-pay | Admitting: *Deleted

## 2013-03-10 DIAGNOSIS — M79609 Pain in unspecified limb: Secondary | ICD-10-CM

## 2013-04-22 ENCOUNTER — Encounter: Payer: Self-pay | Admitting: Surgery

## 2013-04-25 ENCOUNTER — Encounter: Payer: Self-pay | Admitting: Surgery

## 2013-04-25 ENCOUNTER — Ambulatory Visit (INDEPENDENT_AMBULATORY_CARE_PROVIDER_SITE_OTHER): Payer: Medicare Other | Admitting: Surgery

## 2013-04-25 ENCOUNTER — Encounter (INDEPENDENT_AMBULATORY_CARE_PROVIDER_SITE_OTHER): Payer: Medicare Other

## 2013-04-25 VITALS — BP 160/88 | HR 46 | Resp 16 | Ht 67.0 in | Wt 145.0 lb

## 2013-04-25 DIAGNOSIS — M79609 Pain in unspecified limb: Secondary | ICD-10-CM | POA: Insufficient documentation

## 2013-04-25 DIAGNOSIS — R230 Cyanosis: Secondary | ICD-10-CM | POA: Insufficient documentation

## 2013-04-25 NOTE — Progress Notes (Signed)
[facility] [place]   Patient name: Daniel Taylor MRN: [id number] DOB: 1946/04/18 Sex: male   Referred by: Dr. Tenny Craw  Reason for referral:  Chief Complaint  Patient presents with  . New Evaluation    Painful Right plantar foot, discoloration and lump, duration ? 02/16/13    HISTORY OF PRESENT ILLNESS: This is a very pleasant 67 year old gentleman who comes in today for evaluation of a lump on the bottom of his right foot. The patient states that he noticed this approximately 2 months ago after undergoing shoulder surgery. After his surgery he could not put on socks and was wearing a hard bottom shoes. It was during this time that he began having pain in the bottom of his foot and identified the knot. Once he became capable of putting on size and using inserted issues again, he has not had any pain. No longer bothers him.  The patient suffers from diabetes. His hemoglobin A1c is usually around 7. His hypercholesterolemia is treated with a statin. He is on an ACE inhibitor for hypertension. He has no history of coronary artery disease. He does take an aspirin for antiplatelet therapy.  Past Medical History  Diagnosis Date  . Diabetes mellitus   . Hyperlipidemia   . Anxiety   . Hypertension     pcp  dr Tenny Craw   @ [facility]  . Right rotator cuff tear     Past Surgical History  Procedure Laterality Date  . Inguinal hernia repair  09/10/2011    Procedure: HERNIA REPAIR INGUINAL ADULT;  Surgeon: Wilmon Arms. Corliss Skains, MD;  Location: [facility];  Service: General;  Laterality: Left;  REPAIR LEFT HERNIA INGUINAL ADULT WITH MESH  . Tonsillectomy and adenoidectomy  67 years old  . Hernia repair  INFANT  . Shoulder open rotator cuff repair Right 02/16/2013    Procedure: OPEN RIGHT SHOULDER ROTATOR CUFF REPAIR WITH GRAFT AND ANCHORS;  Surgeon: Jacki Cones, MD;  Location: [facility];  Service: Orthopedics;  Laterality: Right;    History   Social History  . Marital  Status: Married    Spouse Name: N/A    Number of Children: N/A  . Years of Education: N/A   Occupational History  . Not on file.   Social History Main Topics  . Smoking status: Never Smoker   . Smokeless tobacco: Never Used  . Alcohol Use: Yes     Comment: socially  . Drug Use: No  . Sexually Active: Not on file   Other Topics Concern  . Not on file   Social History Narrative  . No narrative on file    Family History  Problem Relation Age of Onset  . Cancer Father     Allergies as of 04/25/2013  . (No Known Allergies)    Current Outpatient Prescriptions on File Prior to Visit  Medication Sig Dispense Refill  . aspirin 325 MG EC tablet Take 325 mg by mouth daily.       Marland Kitchen atorvastatin (LIPITOR) 10 MG tablet Take 10 mg by mouth daily.        Marland Kitchen HYDROmorphone (DILAUDID) 2 MG tablet Take 1 tablet (2 mg total) by mouth every 4 (four) hours as needed.  60 tablet  0  . ibuprofen (ADVIL,MOTRIN) 200 MG tablet Take 200 mg by mouth 2 (two) times daily as needed. For back pain.       Marland Kitchen lisinopril (PRINIVIL,ZESTRIL) 40 MG tablet Take 40 mg by mouth daily.        Marland Kitchen  metFORMIN (GLUCOPHAGE) 500 MG tablet Take 1,000 mg by mouth 2 (two) times daily with a meal.       . methocarbamol (ROBAXIN) 500 MG tablet Take 1 tablet (500 mg total) by mouth every 6 (six) hours as needed.  40 tablet  1   No current facility-administered medications on file prior to visit.     REVIEW OF SYSTEMS: Please see history of present illness, otherwise all systems are negative  PHYSICAL EXAMINATION: General: The patient appears their stated age.  Vital signs are BP 160/88  Pulse 46  Resp 16  Ht 5\' 7"  (1.702 m)  Wt 145 lb (65.772 kg)  BMI 22.71 kg/m2  SpO2 97% HEENT:  No gross abnormalities Pulmonary: Respirations are non-labored Abdomen: Soft and non-tender  Musculoskeletal: There are no major deformities.   Neurologic: No focal weakness or paresthesias are detected, Skin: 2 mm area near the ball of  his right foot which is hard. It is not tender. There is no evidence of erythema or infection Psychiatric: The patient has normal affect. Cardiovascular: There is a regular rate and rhythm without significant murmur appreciated. No carotid bruit. Palpable dorsalis pedis and posterior tibial artery pulses bilaterally  Diagnostic Studies: Bloodflow studies were ordered and reviewed today. His ABI is 1.1 bilaterally. Waveforms are triphasic on the right and biphasic on the left. His toe pressure is 138 on the right and 176 on the left.    Assessment:  Right foot nodule Plan: Once the patient began being able to wear shoes inserts and socks, this nodule is no longer painful. There is no skin breakdown or any evidence of infection. His bloodflow studies are normal. I do not think this is a vascular problem. I told him that as long as it was asymptomatic that I would not do anything to it. If he notices that he is experiencing some skin breakdown or it becomes inflamed to please contact me.     Jorge Ny, M.D. [facility] [place] Office: [phone] Pager:  [phone]

## 2013-12-13 ENCOUNTER — Other Ambulatory Visit (HOSPITAL_COMMUNITY): Payer: Self-pay | Admitting: Urology

## 2013-12-13 DIAGNOSIS — R972 Elevated prostate specific antigen [PSA]: Secondary | ICD-10-CM

## 2013-12-27 ENCOUNTER — Ambulatory Visit (HOSPITAL_COMMUNITY): Payer: Medicare Other

## 2015-10-23 DIAGNOSIS — S300XXA Contusion of lower back and pelvis, initial encounter: Secondary | ICD-10-CM | POA: Diagnosis not present

## 2015-12-06 DIAGNOSIS — M722 Plantar fascial fibromatosis: Secondary | ICD-10-CM | POA: Diagnosis not present

## 2015-12-31 DIAGNOSIS — E119 Type 2 diabetes mellitus without complications: Secondary | ICD-10-CM | POA: Diagnosis not present

## 2016-01-24 DIAGNOSIS — M79673 Pain in unspecified foot: Secondary | ICD-10-CM | POA: Diagnosis not present

## 2016-01-24 DIAGNOSIS — Z0000 Encounter for general adult medical examination without abnormal findings: Secondary | ICD-10-CM | POA: Diagnosis not present

## 2016-01-24 DIAGNOSIS — E782 Mixed hyperlipidemia: Secondary | ICD-10-CM | POA: Diagnosis not present

## 2016-01-24 DIAGNOSIS — E119 Type 2 diabetes mellitus without complications: Secondary | ICD-10-CM | POA: Diagnosis not present

## 2016-01-24 DIAGNOSIS — I10 Essential (primary) hypertension: Secondary | ICD-10-CM | POA: Diagnosis not present

## 2016-01-24 DIAGNOSIS — Z23 Encounter for immunization: Secondary | ICD-10-CM | POA: Diagnosis not present

## 2016-01-24 DIAGNOSIS — Z7984 Long term (current) use of oral hypoglycemic drugs: Secondary | ICD-10-CM | POA: Diagnosis not present

## 2016-01-29 DIAGNOSIS — M79673 Pain in unspecified foot: Secondary | ICD-10-CM | POA: Diagnosis not present

## 2016-01-29 DIAGNOSIS — E119 Type 2 diabetes mellitus without complications: Secondary | ICD-10-CM | POA: Diagnosis not present

## 2016-01-29 DIAGNOSIS — I10 Essential (primary) hypertension: Secondary | ICD-10-CM | POA: Diagnosis not present

## 2016-01-29 DIAGNOSIS — Z7984 Long term (current) use of oral hypoglycemic drugs: Secondary | ICD-10-CM | POA: Diagnosis not present

## 2016-01-29 DIAGNOSIS — E782 Mixed hyperlipidemia: Secondary | ICD-10-CM | POA: Diagnosis not present

## 2016-01-29 DIAGNOSIS — Z0000 Encounter for general adult medical examination without abnormal findings: Secondary | ICD-10-CM | POA: Diagnosis not present

## 2016-05-29 DIAGNOSIS — M25512 Pain in left shoulder: Secondary | ICD-10-CM | POA: Diagnosis not present

## 2016-05-29 DIAGNOSIS — M545 Low back pain: Secondary | ICD-10-CM | POA: Diagnosis not present

## 2016-05-29 DIAGNOSIS — S40012A Contusion of left shoulder, initial encounter: Secondary | ICD-10-CM | POA: Diagnosis not present

## 2016-07-17 DIAGNOSIS — Z7984 Long term (current) use of oral hypoglycemic drugs: Secondary | ICD-10-CM | POA: Diagnosis not present

## 2016-07-17 DIAGNOSIS — E119 Type 2 diabetes mellitus without complications: Secondary | ICD-10-CM | POA: Diagnosis not present

## 2017-02-09 DIAGNOSIS — M533 Sacrococcygeal disorders, not elsewhere classified: Secondary | ICD-10-CM | POA: Diagnosis not present

## 2017-02-09 DIAGNOSIS — I10 Essential (primary) hypertension: Secondary | ICD-10-CM | POA: Diagnosis not present

## 2017-02-09 DIAGNOSIS — Z7984 Long term (current) use of oral hypoglycemic drugs: Secondary | ICD-10-CM | POA: Diagnosis not present

## 2017-02-09 DIAGNOSIS — E119 Type 2 diabetes mellitus without complications: Secondary | ICD-10-CM | POA: Diagnosis not present

## 2017-02-09 DIAGNOSIS — E782 Mixed hyperlipidemia: Secondary | ICD-10-CM | POA: Diagnosis not present

## 2017-02-09 DIAGNOSIS — Z0000 Encounter for general adult medical examination without abnormal findings: Secondary | ICD-10-CM | POA: Diagnosis not present

## 2017-02-09 DIAGNOSIS — Z125 Encounter for screening for malignant neoplasm of prostate: Secondary | ICD-10-CM | POA: Diagnosis not present

## 2017-04-21 DIAGNOSIS — E119 Type 2 diabetes mellitus without complications: Secondary | ICD-10-CM | POA: Diagnosis not present

## 2017-08-17 DIAGNOSIS — Z7984 Long term (current) use of oral hypoglycemic drugs: Secondary | ICD-10-CM | POA: Diagnosis not present

## 2017-08-17 DIAGNOSIS — E1165 Type 2 diabetes mellitus with hyperglycemia: Secondary | ICD-10-CM | POA: Diagnosis not present

## 2017-08-17 DIAGNOSIS — I10 Essential (primary) hypertension: Secondary | ICD-10-CM | POA: Diagnosis not present

## 2017-08-17 DIAGNOSIS — R972 Elevated prostate specific antigen [PSA]: Secondary | ICD-10-CM | POA: Diagnosis not present

## 2018-01-13 DIAGNOSIS — J029 Acute pharyngitis, unspecified: Secondary | ICD-10-CM | POA: Diagnosis not present

## 2018-02-15 DIAGNOSIS — E782 Mixed hyperlipidemia: Secondary | ICD-10-CM | POA: Diagnosis not present

## 2018-02-15 DIAGNOSIS — Z1389 Encounter for screening for other disorder: Secondary | ICD-10-CM | POA: Diagnosis not present

## 2018-02-15 DIAGNOSIS — Z7984 Long term (current) use of oral hypoglycemic drugs: Secondary | ICD-10-CM | POA: Diagnosis not present

## 2018-02-15 DIAGNOSIS — I499 Cardiac arrhythmia, unspecified: Secondary | ICD-10-CM | POA: Diagnosis not present

## 2018-02-15 DIAGNOSIS — I4892 Unspecified atrial flutter: Secondary | ICD-10-CM | POA: Diagnosis not present

## 2018-02-15 DIAGNOSIS — E119 Type 2 diabetes mellitus without complications: Secondary | ICD-10-CM | POA: Diagnosis not present

## 2018-02-15 DIAGNOSIS — I10 Essential (primary) hypertension: Secondary | ICD-10-CM | POA: Diagnosis not present

## 2018-02-15 DIAGNOSIS — Z0000 Encounter for general adult medical examination without abnormal findings: Secondary | ICD-10-CM | POA: Diagnosis not present

## 2018-02-15 DIAGNOSIS — Z125 Encounter for screening for malignant neoplasm of prostate: Secondary | ICD-10-CM | POA: Diagnosis not present

## 2018-02-15 DIAGNOSIS — J329 Chronic sinusitis, unspecified: Secondary | ICD-10-CM | POA: Diagnosis not present

## 2018-02-26 ENCOUNTER — Telehealth: Payer: Self-pay

## 2018-02-26 NOTE — Telephone Encounter (Signed)
Faxed notes to nl

## 2018-03-13 DIAGNOSIS — I480 Paroxysmal atrial fibrillation: Secondary | ICD-10-CM

## 2018-03-13 HISTORY — DX: Paroxysmal atrial fibrillation: I48.0

## 2018-04-07 ENCOUNTER — Encounter: Payer: Self-pay | Admitting: Cardiology

## 2018-04-07 ENCOUNTER — Ambulatory Visit (INDEPENDENT_AMBULATORY_CARE_PROVIDER_SITE_OTHER): Payer: PPO | Admitting: Cardiology

## 2018-04-07 ENCOUNTER — Encounter

## 2018-04-07 VITALS — BP 154/84 | HR 67 | Ht 67.0 in | Wt 150.5 lb

## 2018-04-07 DIAGNOSIS — I10 Essential (primary) hypertension: Secondary | ICD-10-CM

## 2018-04-07 DIAGNOSIS — E8881 Metabolic syndrome: Secondary | ICD-10-CM | POA: Diagnosis not present

## 2018-04-07 DIAGNOSIS — I4891 Unspecified atrial fibrillation: Secondary | ICD-10-CM | POA: Diagnosis not present

## 2018-04-07 DIAGNOSIS — E785 Hyperlipidemia, unspecified: Secondary | ICD-10-CM | POA: Diagnosis not present

## 2018-04-07 DIAGNOSIS — I4821 Permanent atrial fibrillation: Secondary | ICD-10-CM | POA: Insufficient documentation

## 2018-04-07 DIAGNOSIS — E119 Type 2 diabetes mellitus without complications: Secondary | ICD-10-CM | POA: Diagnosis not present

## 2018-04-07 DIAGNOSIS — I480 Paroxysmal atrial fibrillation: Secondary | ICD-10-CM | POA: Insufficient documentation

## 2018-04-07 DIAGNOSIS — E1169 Type 2 diabetes mellitus with other specified complication: Secondary | ICD-10-CM

## 2018-04-07 NOTE — Progress Notes (Signed)
PCP: Lawerance Cruel, MD  Clinic Note: Chief Complaint  Patient presents with  . New Patient (Initial Visit)    irregular heart rhythm per pcp     HPI: Daniel Taylor is a 72 y.o. male who is being seen today for the evaluation of EKG with Afib/Flutter on PCP EKG (not available upon clinic visit) at the request of Lawerance Cruel, MD.   Daniel Taylor was just seen on Feb 15, 2018 by Dr. Harrington Challenger for routine follow-up for hypertension and diabetes, hyperlipidemia.  On exam he is noted to have an irregular heart rhythm with an EKG that showed atrial fibrillation with a rate of 68 bpm --  The patient was not able to remember just what the abnormal rhythm was, but based upon the reason for referral & initiation of the DOAC coverage (Eliquis), I would presume that the EKG showed Afib/Flutter. (Unfortunately, I did not have a copy of the EKG upon initial evaluation -- was faxed over, but after the patient left.).   Recent Hospitalizations: none; was treated with OP Abx for sinus infection - stopped early due to GI Sx.: No  Studies Personally Reviewed - (if available, images/films reviewed: From Epic Chart or Care Everywhere)  PCP EKG (faxed in late): Coarse A fib, rate 68 bpm (not on any rate control agents).  Otherwise normal axis, intervals & durations with no ischemic changes.  Interval History: Daniel Taylor presents today really with no symptoms to speak of.  He was not aware that he was in an unusual rhythm when he saw his PCP.  He had no symptoms of irregular heartbeats or palpitations.  He can tell the difference between today and when he saw his PCP.  He denies having any exertional chest tightness or pressure, but because he has been out of shape does have some dyspnea on exertion but more with significant exertion.  No heart failure symptoms of PND, orthopnea or edema. No rapid heartbeat sensations.  No syncope/near syncope or TIA symptoms fugax.  No bleeding issues on recently started  Eliquis.  No melena, hematochezia hematuria or epistaxis.   ROS: A comprehensive was performed. Review of Systems  Constitutional: Negative for chills, fever, malaise/fatigue and weight loss.  HENT: Negative for congestion.   Respiratory: Negative for cough, shortness of breath and wheezing.   Gastrointestinal: Negative for abdominal pain, constipation and heartburn.  Neurological: Negative for dizziness, focal weakness, loss of consciousness and headaches.  Psychiatric/Behavioral: Negative.  Negative for memory loss. The patient does not have insomnia.   All other systems reviewed and are negative.  I have reviewed and (if needed) personally updated the patient's problem list, medications, allergies, past medical and surgical history, social and family history.   Past Medical History:  Diagnosis Date  . Anxiety   . Diabetes mellitus   . Hyperlipidemia   . Hypertension    pcp  dr Harrington Challenger   @ [facility]  . Right rotator cuff tear     Past Surgical History:  Procedure Laterality Date  . HERNIA REPAIR  INFANT  . INGUINAL HERNIA REPAIR  09/10/2011   Procedure: HERNIA REPAIR INGUINAL ADULT;  Surgeon: Imogene Burn. Georgette Dover, MD;  Location: [facility];  Service: General;  Laterality: Left;  REPAIR LEFT HERNIA INGUINAL ADULT WITH MESH  . SHOULDER OPEN ROTATOR CUFF REPAIR Right 02/16/2013   Procedure: OPEN RIGHT SHOULDER ROTATOR CUFF REPAIR WITH GRAFT AND ANCHORS;  Surgeon: Tobi Bastos, MD;  Location[facility];  Service: Orthopedics;  Laterality: Right;  . TONSILLECTOMY AND ADENOIDECTOMY  72 years old    Current Meds  Medication Sig  . atorvastatin (LIPITOR) 10 MG tablet Take 10 mg by mouth daily.    Marland Kitchen glimepiride (AMARYL) 1 MG tablet Take 1 mg by mouth daily with breakfast.  . lisinopril (PRINIVIL,ZESTRIL) 40 MG tablet Take 40 mg by mouth daily.    . metFORMIN (GLUCOPHAGE) 500 MG tablet Take 1,000 mg by mouth 2 (two) times daily with a meal.     No Known Allergies  Social  History   Tobacco Use  . Smoking status: Former Smoker    Packs/day: 1.00    Years: 4.00    Pack years: 4.00    Types: Cigarettes    Start date: 1975    Last attempt to quit: 1979    Years since quitting: 40.5  . Smokeless tobacco: Never Used  Substance Use Topics  . Alcohol use: Yes    Comment: socially - 1-7 / month  . Drug use: No   Social History   Social History Narrative   Married - 2 children (adopted) with 4 grandchildren.   Works in [place]    2 cups coffee / day, 1 of tea.   Trying to get back to exercise - always up & about, on feet.  May do exercise bike ~30 min or more.    family history includes Depression in his mother; Healthy in his brother; Lung cancer in his father.  Wt Readings from Last 3 Encounters:  04/07/18 150 lb 8 oz (68.3 kg)  04/25/13 145 lb (65.8 kg)  02/16/13 147 lb 5 oz (66.8 kg)    PHYSICAL EXAM BP (!) 154/84   Pulse 67   Ht 5\' 7"  (1.702 m)   Wt 150 lb 8 oz (68.3 kg)   SpO2 96%   BMI 23.57 kg/m  Physical Exam  Constitutional: He is oriented to person, place, and time. He appears well-developed and well-nourished. No distress.  Healthy-appearing.  Well-groomed.  HENT:  Head: Normocephalic and atraumatic.  Mouth/Throat: Oropharynx is clear and moist. No oropharyngeal exudate.  Eyes: Pupils are equal, round, and reactive to light. Conjunctivae and EOM are normal.  Neck: Normal range of motion. Neck supple. No hepatojugular reflux and no JVD present. Carotid bruit is not present.  Cardiovascular: Normal rate, regular rhythm, normal heart sounds, intact distal pulses and normal pulses.  No extrasystoles are present. PMI is not displaced. Exam reveals no gallop and no friction rub.  No murmur heard. Pulmonary/Chest: Effort normal and breath sounds normal. No respiratory distress. He has no wheezes. He has no rales.  Abdominal: Soft. Bowel sounds are normal. He exhibits no distension. There is no tenderness. There is no  rebound.  Musculoskeletal: Normal range of motion. He exhibits no edema.  Neurological: He is alert and oriented to person, place, and time. No cranial nerve deficit.  Skin: Skin is warm and dry. No rash noted. No erythema.  Psychiatric: He has a normal mood and affect. His behavior is normal. Judgment and thought content normal.  Vitals reviewed.   Adult ECG Report  Rate: 78 ;  Rhythm: normal sinus rhythm and Normal axis, intervals and durations.  ;   Narrative Interpretation: Normal EKG. (Compared to PCP EKG in May 2019 -coarse A. fib no longer present).   Other studies Reviewed: Additional studies/ records that were reviewed today include:  Recent Labs: 02/2018  Na+ 142, K+ 5.0, Cl- 105, HCO3- 28,  BUN 22, Cr 0.98, Glu 104, Ca2+ 10.1; AST 20, ALT 26, AlkP 56,    CBC: W 8.6, H/H 15.4/47.5, Plt 247;; A1c 7.4 (from 01/2017 7.1)  TC 167, TG 164, HDL 63, LDL 71 (from 01/2017 - TC 153, TG 100, HDL 74, LDL 59)   This patients CHA2DS2-VASc Score and unadjusted Ischemic Stroke Rate (% per year) is equal to 3.2 % stroke rate/year from a score of 3  Above score calculated as 1 point each if present [CHF, HTN, DM, Vascular=MI/PAD/Aortic Plaque, Age if 65-74, or Male] Above score calculated as 2 points each if present [Age > 75, or Stroke/TIA/TE]  ASSESSMENT / PLAN: Problem List Items Addressed This Visit    New onset atrial fibrillation ([place]) - Primary    Essentially new diagnosis of paroxysmal atrial fibrillation.  No longer in A. fib now.  Totally symptomatic which is somewhat concerning with n not knowing history of A. fib burden.  --With cardiac risk factors of hypertension, hyperlipidemia and diabetes with former smoking history, need to exclude primary cardiac issue either ischemic or structural.  Plan: 2D echo and Myoview.  Currently rate controlled without any -AV nodal agents.  As long as he is not having symptoms, I would avoid bradycardia with adding an agent   Unless he shows  signs of tachycardia.      Relevant Orders   EKG 12-Lead (Completed)   ECHOCARDIOGRAM COMPLETE   MYOCARDIAL PERFUSION IMAGING   Metabolic syndrome (Chronic)    DM-2, HTN & high TG.  Not obese. Will need RF management - BP has been controlled, lipids look good (but worse over last yr), DM - pretty stable.      Hyperlipidemia due to type 2 diabetes mellitus ([place]) (Chronic)    Pretty well controlled on low-dose statin. TG 182 (#3 metabolic syndrome)      Relevant Medications   glimepiride (AMARYL) 1 MG tablet   Essential hypertension (Chronic)    Pretty hypertensive today, but he is somewhat anxious.  Blood pressure looks much better PCPs office. Not on any rate control agent such as beta-blocker or calcium channel blocker.  Is only on lisinopril as a diabetic. (#2 of metabolic syndrome)      Diabetes mellitus type 2, noninsulin dependent (HCC) (Chronic)    On glimepiride and metformin.  A1c stable at roughly 7-7.4. --> #1 of metabolic syndrome.      Relevant Medications   glimepiride (AMARYL) 1 MG tablet   Other Relevant Orders   ECHOCARDIOGRAM COMPLETE   MYOCARDIAL PERFUSION IMAGING       I spent a total of 45 minutes with the patient and chart review. >  50% of the time was spent in direct patient consultation.   Current medicines are reviewed at length with the patient today.  (+/- concerns) DISCUSSED indications & recommendations re DOAC.  The following changes have been made:  see below  Patient Instructions  No medication changes    SCHEDULE AT [place] has requested that you have an echocardiogram. Echocardiography is a painless test that uses sound waves to create images of your heart. It provides your doctor with information about the size and shape of your heart and how well your heart's chambers and valves are working. This procedure takes approximately one hour. There are no restrictions for this  procedure.   SCHEDULE AT [place] Your physician has requested that you have en exercise stress myoview. For further information  please visit [email or web address]. Please follow instruction sheet, as given.   Your physician recommends that you schedule a follow-up appointment in [place].   If you need a refill on your cardiac medications before your next appointment, please call your pharmacy.     Studies Ordered:   Orders Placed This Encounter  Procedures  . MYOCARDIAL PERFUSION IMAGING  . EKG 12-Lead  . ECHOCARDIOGRAM COMPLETE      Glenetta Hew, M.D., M.S. Interventional Cardiologist   Pager # [phone] Phone # [phone] [address] [place], [place] [address]   Thank you for choosing [facility]!!

## 2018-04-07 NOTE — Patient Instructions (Signed)
No medication changes    SCHEDULE AT [place] has requested that you have an echocardiogram. Echocardiography is a painless test that uses sound waves to create images of your heart. It provides your doctor with information about the size and shape of your heart and how well your heart's chambers and valves are working. This procedure takes approximately one hour. There are no restrictions for this procedure.   SCHEDULE AT [place] Your physician has requested that you have en exercise stress myoview. For further information please visit [email or web address]. Please follow instruction sheet, as given.   Your physician recommends that you schedule a follow-up appointment in [place].   If you need a refill on your cardiac medications before your next appointment, please call your pharmacy.

## 2018-04-08 ENCOUNTER — Telehealth: Payer: Self-pay | Admitting: Cardiology

## 2018-04-08 NOTE — Telephone Encounter (Signed)
SPOKE TO PATIENT. INFORMED PATIENT  DR HARDING DID RECEIVE A COPY OF PATIENT'S EKG FROM DR ROSS'S OFFICE. PER DR HARDING, IT DID SHOW IRREG. HEARTRATE. AND  DR HARDING TO HAVE THE TEST THAT WHERE ORDER AR OFFICE VISIT  PATIENT THANKED RN , PATIENT STATED HE JUST WANTED TO MAKE SURE OFFICE RECEIVED THE EKG. PATIENT STATES HE WILL PROCEED WITH TEST.

## 2018-04-12 ENCOUNTER — Ambulatory Visit (HOSPITAL_COMMUNITY): Payer: PPO | Attending: Cardiovascular Disease

## 2018-04-12 ENCOUNTER — Encounter: Payer: Self-pay | Admitting: Cardiology

## 2018-04-12 ENCOUNTER — Other Ambulatory Visit: Payer: Self-pay

## 2018-04-12 DIAGNOSIS — E119 Type 2 diabetes mellitus without complications: Secondary | ICD-10-CM | POA: Insufficient documentation

## 2018-04-12 DIAGNOSIS — E785 Hyperlipidemia, unspecified: Secondary | ICD-10-CM | POA: Insufficient documentation

## 2018-04-12 DIAGNOSIS — I10 Essential (primary) hypertension: Secondary | ICD-10-CM | POA: Diagnosis not present

## 2018-04-12 DIAGNOSIS — I088 Other rheumatic multiple valve diseases: Secondary | ICD-10-CM | POA: Insufficient documentation

## 2018-04-12 DIAGNOSIS — E1169 Type 2 diabetes mellitus with other specified complication: Secondary | ICD-10-CM | POA: Insufficient documentation

## 2018-04-12 DIAGNOSIS — I4891 Unspecified atrial fibrillation: Secondary | ICD-10-CM | POA: Insufficient documentation

## 2018-04-12 DIAGNOSIS — E8881 Metabolic syndrome: Secondary | ICD-10-CM | POA: Insufficient documentation

## 2018-04-12 HISTORY — PX: NM MYOVIEW LTD: HXRAD82

## 2018-04-12 HISTORY — PX: TRANSTHORACIC ECHOCARDIOGRAM: SHX275

## 2018-04-12 NOTE — Assessment & Plan Note (Addendum)
Pretty hypertensive today, but he is somewhat anxious.  Blood pressure looks much better PCPs office. Not on any rate control agent such as beta-blocker or calcium channel blocker.  Is only on lisinopril as a diabetic. (#2 of metabolic syndrome)

## 2018-04-12 NOTE — Assessment & Plan Note (Addendum)
Essentially new diagnosis of paroxysmal atrial fibrillation.  No longer in A. fib now.  Totally symptomatic which is somewhat concerning with n not knowing history of A. fib burden.  --With cardiac risk factors of hypertension, hyperlipidemia and diabetes with former smoking history, need to exclude primary cardiac issue either ischemic or structural.  Plan: 2D echo and Myoview.  Currently rate controlled without any -AV nodal agents.  As long as he is not having symptoms, I would avoid bradycardia with adding an agent   Unless he shows signs of tachycardia.

## 2018-04-12 NOTE — Assessment & Plan Note (Signed)
Pretty well controlled on low-dose statin. TG 558 (#3 metabolic syndrome)

## 2018-04-12 NOTE — Assessment & Plan Note (Addendum)
On glimepiride and metformin.  A1c stable at roughly 7-7.4. --> #1 of metabolic syndrome.

## 2018-04-12 NOTE — Assessment & Plan Note (Signed)
DM-2, HTN & high TG.  Not obese. Will need RF management - BP has been controlled, lipids look good (but worse over last yr), DM - pretty stable.

## 2018-04-16 ENCOUNTER — Telehealth (HOSPITAL_COMMUNITY): Payer: Self-pay

## 2018-04-16 NOTE — Telephone Encounter (Signed)
Encounter complete. 

## 2018-04-21 ENCOUNTER — Telehealth: Payer: Self-pay | Admitting: Cardiology

## 2018-04-21 ENCOUNTER — Ambulatory Visit (HOSPITAL_COMMUNITY)
Admission: RE | Admit: 2018-04-21 | Discharge: 2018-04-21 | Disposition: A | Discharge status: Home / Self Care | Payer: PPO | Source: Ambulatory Visit | Attending: Cardiology | Admitting: Cardiology

## 2018-04-21 DIAGNOSIS — E119 Type 2 diabetes mellitus without complications: Secondary | ICD-10-CM | POA: Diagnosis not present

## 2018-04-21 DIAGNOSIS — I4891 Unspecified atrial fibrillation: Secondary | ICD-10-CM

## 2018-04-21 LAB — MYOCARDIAL PERFUSION IMAGING
CHL CUP NUCLEAR SRS: 4
CHL CUP RESTING HR STRESS: 74 {beats}/min
CSEPED: 6 min
CSEPEDS: 52 s
CSEPEW: 8.3 METS
CSEPHR: 104 %
MPHR: 150 {beats}/min
Peak HR: 157 {beats}/min
RPE: 17
SDS: 1
SSS: 5
TID: 1.07

## 2018-04-21 MED ORDER — REGADENOSON 0.4 MG/5ML IV SOLN
0.4000 mg | Freq: Once | INTRAVENOUS | Status: AC
  Administered 2018-04-21: 0.4 mg via INTRAVENOUS

## 2018-04-21 MED ORDER — TECHNETIUM TC 99M TETROFOSMIN IV KIT
30.8000 | PACK | Freq: Once | INTRAVENOUS | Status: AC | PRN
  Administered 2018-04-21: 30.8 via INTRAVENOUS
  Filled 2018-04-21: qty 31

## 2018-04-21 MED ORDER — TECHNETIUM TC 99M TETROFOSMIN IV KIT
9.9000 | PACK | Freq: Once | INTRAVENOUS | Status: AC | PRN
  Administered 2018-04-21: 9.9 via INTRAVENOUS
  Filled 2018-04-21: qty 10

## 2018-04-21 NOTE — Telephone Encounter (Signed)
Patient is in office for nuclear stress test. Testing EKG demonstrates AF. Per DOD Dr. Martinique, patient should start Eliquis 5mg  BID.   Spoke with patient, who states his PCP Dr. Harrington Challenger put him on Eliquis 5mg  BID after an EKG done at his annual visit demonstrated AF/Flutter. He is currently taking this medication. Advised that he continue this and I would route message to MD/RN.   He would also like copies of his test results mailed to him when they are available.

## 2018-04-22 NOTE — Telephone Encounter (Signed)
Yep - he was on Eliquis (my dictation got messed up on my note).  CHA2DS2Vasc score was 3 - so plan was to continue Eliquis.  Glenetta Hew, MD

## 2018-05-05 ENCOUNTER — Telehealth: Payer: Self-pay

## 2018-05-05 NOTE — Telephone Encounter (Signed)
Pt requested appt sooner than his appt with Dr. Ellyn Hack 9/19 to review his test results and to talk about the Eliquis. He says he has enough for the next month but wants to talk about it before his next refill. Appt made with Meng Pa 05/15/18.

## 2018-05-14 ENCOUNTER — Ambulatory Visit (INDEPENDENT_AMBULATORY_CARE_PROVIDER_SITE_OTHER): Payer: PPO | Admitting: Physician Assistant

## 2018-05-14 ENCOUNTER — Encounter: Payer: Self-pay | Admitting: Physician Assistant

## 2018-05-14 VITALS — BP 150/86 | HR 53 | Ht 67.0 in | Wt 150.0 lb

## 2018-05-14 DIAGNOSIS — E785 Hyperlipidemia, unspecified: Secondary | ICD-10-CM

## 2018-05-14 DIAGNOSIS — I480 Paroxysmal atrial fibrillation: Secondary | ICD-10-CM

## 2018-05-14 DIAGNOSIS — E119 Type 2 diabetes mellitus without complications: Secondary | ICD-10-CM

## 2018-05-14 DIAGNOSIS — I10 Essential (primary) hypertension: Secondary | ICD-10-CM

## 2018-05-14 MED ORDER — APIXABAN 5 MG PO TABS: 5.0000 mg | ORAL_TABLET | Freq: Two times a day (BID) | ORAL | 2 refills | Status: AC

## 2018-05-14 NOTE — Patient Instructions (Signed)
Medication Instructions:  Your physician recommends that you continue on your current medications as directed. Please refer to the Current Medication list given to you today.  Labwork: None   Testing/Procedures: None   Follow-Up: FOLLOW UP AS SCHEDULED   Any Other Special Instructions Will Be Listed Below (If Applicable). If you need a refill on your cardiac medications before your next appointment, please call your pharmacy.

## 2018-05-14 NOTE — Progress Notes (Signed)
Cardiology Office Note    Date:  05/16/2018   ID:  [name], DOB Aug 06, 1946, MRN [id number]  PCP:  Lawerance Cruel, MD  Cardiologist:  Dr. Ellyn Hack  Chief Complaint  Patient presents with  . Follow-up    Discuss Eliquis and test results.    History of Present Illness:  Daniel Taylor is a 72 y.o. male with PMH of hypertension, hyperlipidemia, DM 2, PAF and anxiety.  Patient was recently referred to Dr. Ellyn Hack in June 2019 after his primary care provider noticed he had irregular heart rhythm with a EKG that showed atrial fibrillation with controlled heart rate of 68 bpm.  He was not on any AV nodal agent.  He was placed on Eliquis for stroke prevention.  By the time he was seen by Dr. Ellyn Hack, he was no longer in atrial fibrillation.  Echocardiogram obtained on 04/12/2018 showed EF 55%, grade 1 DD, mildly dilated left atrium.  Myoview obtained on 04/21/2018 was normal without ischemia, the study was not gated due to atrial fibrillation.  Patient presents today for cardiology office visit.  He denies any recent chest pain or shortness of breath.  We have reviewed his recent echocardiogram results and the stress test result.  His annual risk of stroke on the Eliquis is 1.1%, however without the Eliquis, his annual risk of stroke is 3.2%.  His absolute annual risk of major bleeding is 1.5%.  Otherwise he has no lower extremity edema, orthopnea or PND.  I recommend him to continue on the current dose of Eliquis and will give him a refill.  His blood pressure is mildly elevated today, however he says his blood pressure is normally in the 130s at home.  I will hold off on adjusting his blood pressure medication however I instructed him to keep a blood pressure diary and bring it to the next office visit.  I offered him to delay his next office visit with Dr. Ellyn Hack from September to 3 to 4 months from now, however he wished to think about this I will keep his current visitation time with Dr.  Ellyn Hack.   Past Medical History:  Diagnosis Date  . Anxiety   . Diabetes mellitus   . Hyperlipidemia   . Hypertension    pcp  dr Harrington Challenger   @ [facility]  . Right rotator cuff tear     Past Surgical History:  Procedure Laterality Date  . HERNIA REPAIR  INFANT  . INGUINAL HERNIA REPAIR  09/10/2011   Procedure: HERNIA REPAIR INGUINAL ADULT;  Surgeon: Imogene Burn. Georgette Dover, MD;  Location: [facility];  Service: General;  Laterality: Left;  REPAIR LEFT HERNIA INGUINAL ADULT WITH MESH  . SHOULDER OPEN ROTATOR CUFF REPAIR Right 02/16/2013   Procedure: OPEN RIGHT SHOULDER ROTATOR CUFF REPAIR WITH GRAFT AND ANCHORS;  Surgeon: Tobi Bastos, MD;  Location: [facility];  Service: Orthopedics;  Laterality: Right;  . TONSILLECTOMY AND ADENOIDECTOMY  72 years old    Current Medications: Outpatient Medications Prior to Visit  Medication Sig Dispense Refill  . atorvastatin (LIPITOR) 10 MG tablet Take 10 mg by mouth daily.      Marland Kitchen glimepiride (AMARYL) 1 MG tablet Take 1 mg by mouth daily with breakfast.    . lisinopril (PRINIVIL,ZESTRIL) 40 MG tablet Take 40 mg by mouth daily.      . metFORMIN (GLUCOPHAGE) 500 MG tablet Take 1,000 mg by mouth 2 (two) times daily with a meal.     .  apixaban (ELIQUIS) 5 MG TABS tablet Take 5 mg by mouth 2 (two) times daily.     No facility-administered medications prior to visit.      Allergies:   Patient has no known allergies.   Social History   Socioeconomic History  . Marital status: Married    Spouse name: Not on file  . Number of children: Not on file  . Years of education: Not on file  . Highest education level: Not on file  Occupational History  . Not on file  Social Needs  . Financial resource strain: Not on file  . Food insecurity:    Worry: Not on file    Inability: Not on file  . Transportation needs:    Medical: Not on file    Non-medical: Not on file  Tobacco Use  . Smoking status: Former Smoker    Packs/day: 1.00    Years: 4.00     Pack years: 4.00    Types: Cigarettes    Start date: 1975    Last attempt to quit: 1979    Years since quitting: 40.6  . Smokeless tobacco: Never Used  Substance and Sexual Activity  . Alcohol use: Yes    Comment: socially - 1-7 / month  . Drug use: No  . Sexual activity: Not on file  Lifestyle  . Physical activity:    Days per week: Not on file    Minutes per session: Not on file  . Stress: Not on file  Relationships  . Social connections:    Talks on phone: Not on file    Gets together: Not on file    Attends religious service: Not on file    Active member of club or organization: Not on file    Attends meetings of clubs or organizations: Not on file    Relationship status: Not on file  Other Topics Concern  . Not on file  Social History Narrative   Married - 2 children (adopted) with 4 grandchildren.   Works in [place]    2 cups coffee / day, 1 of tea.   Trying to get back to exercise - always up & about, on feet.  May do exercise bike ~30 min or more.     Family History:  The patient's family history includes Depression in his mother; Healthy in his brother; Lung cancer in his father.   ROS:   Please see the history of present illness.    ROS All other systems reviewed and are negative.   PHYSICAL EXAM:   VS:  BP (!) 150/86 (BP Location: Left Arm, Patient Position: Sitting, Cuff Size: Normal)   Pulse (!) 53   Ht 5\' 7"  (1.702 m)   Wt 150 lb (68 kg)   BMI 23.49 kg/m    GEN: Well nourished, well developed, in no acute distress  HEENT: normal  Neck: no JVD, carotid bruits, or masses Cardiac: RRR; no murmurs, rubs, or gallops,no edema  Respiratory:  clear to auscultation bilaterally, normal work of breathing GI: soft, nontender, nondistended, + BS MS: no deformity or atrophy  Skin: warm and dry, no rash Neuro:  Alert and Oriented x 3, Strength and sensation are intact Psych: euthymic mood, full affect  Wt Readings from Last 3 Encounters:    05/14/18 150 lb (68 kg)  04/21/18 150 lb (68 kg)  04/07/18 150 lb 8 oz (68.3 kg)      Studies/Labs Reviewed:   EKG:  EKG is ordered today.  The ekg ordered today demonstrates sinus bradycardia, heart rate 53, no significant ST-T wave changes  Recent Labs: No results found for requested labs within last 8760 hours.   Lipid Panel No results found for: CHOL, TRIG, HDL, CHOLHDL, VLDL, LDLCALC, LDLDIRECT  Additional studies/ records that were reviewed today include:   Echo 04/12/2018 LV EF: 55% Study Conclusions  - Left ventricle: The cavity size was normal. Wall thickness was   increased in a pattern of moderate LVH. The estimated ejection   fraction was 55%. Doppler parameters are consistent with abnormal   left ventricular relaxation (grade 1 diastolic dysfunction). - Aortic valve: There was trivial regurgitation. - Left atrium: The atrium was mildly dilated. - Right atrium: The atrium was mildly dilated. - Atrial septum: A patent foramen ovale cannot be excluded.   Myoview 04/21/2018 Study Highlights    Blood pressure demonstrated a normal response to exercise.  There was no ST segment deviation noted during stress.  The study is normal.  Gated images not performed due to underlying atrial fibrillation.  This is a low risk study.       ASSESSMENT:    1. PAF (paroxysmal atrial fibrillation) ([facility])   2. Essential hypertension   3. Hyperlipidemia, unspecified hyperlipidemia type   4. Controlled type 2 diabetes mellitus without complication, without long-term current use of insulin (HCC)      PLAN:  In order of problems listed above:  1. Paroxysmal atrial fibrillation: On Eliquis.  No recurrence.  Baseline bradycardia does not allow addition of rate control therapy  2. Hypertension: Blood pressure elevated, however according to the patient, his blood pressure is normally quite well controlled at home.  I recommended for him to keep blood pressure diary at  home.  3. Hyperlipidemia: On Lipitor 10 mg daily  4. DM2: On glimepiride and metformin, managed by primary care provider.    Medication Adjustments/Labs and Tests Ordered: Current medicines are reviewed at length with the patient today.  Concerns regarding medicines are outlined above.  Medication changes, Labs and Tests ordered today are listed in the Patient Instructions below. Patient Instructions  Medication Instructions:  Your physician recommends that you continue on your current medications as directed. Please refer to the Current Medication list given to you today.  Labwork: None   Testing/Procedures: None   Follow-Up: FOLLOW UP AS SCHEDULED   Any Other Special Instructions Will Be Listed Below (If Applicable). If you need a refill on your cardiac medications before your next appointment, please call your pharmacy.     Hilbert Corrigan, [place]  05/16/2018 11:24 PM    [facility] [place], [place], [place]  [address] Phone: [phone]; Fax: [phone]

## 2018-05-16 ENCOUNTER — Encounter: Payer: Self-pay | Admitting: Physician Assistant

## 2018-06-21 ENCOUNTER — Encounter: Payer: Self-pay | Admitting: Cardiology

## 2018-06-21 ENCOUNTER — Ambulatory Visit: Payer: PPO | Admitting: Cardiology

## 2018-06-21 VITALS — BP 152/76 | HR 60 | Ht 67.0 in | Wt 153.0 lb

## 2018-06-21 DIAGNOSIS — I10 Essential (primary) hypertension: Secondary | ICD-10-CM | POA: Diagnosis not present

## 2018-06-21 DIAGNOSIS — E785 Hyperlipidemia, unspecified: Secondary | ICD-10-CM | POA: Diagnosis not present

## 2018-06-21 DIAGNOSIS — E1169 Type 2 diabetes mellitus with other specified complication: Secondary | ICD-10-CM

## 2018-06-21 DIAGNOSIS — E8881 Metabolic syndrome: Secondary | ICD-10-CM

## 2018-06-21 DIAGNOSIS — I480 Paroxysmal atrial fibrillation: Secondary | ICD-10-CM | POA: Diagnosis not present

## 2018-06-21 NOTE — Patient Instructions (Signed)
NO CHANGES WITH MEDICATIONS   KEEP BLOOD PRESSURE LOG IF RUNNING HIGH  NOTIFY PRIMARY .    Your physician wants you to follow-up in [place]. You will receive a reminder letter in the mail two months in advance. If you don't receive a letter, please call our office to schedule the follow-up appointment.    If you need a refill on your cardiac medications before your next appointment, please call your pharmacy.

## 2018-06-21 NOTE — Progress Notes (Signed)
PCP: Lawerance Cruel, MD  Clinic Note: Chief Complaint  Patient presents with  . Follow-up    No symptoms  . Atrial Fibrillation    Questions about treatment    HPI: Daniel Taylor is a 72 y.o. male with a PMH notable for hypertension, diabetes, hyperlipidemia and new diagnosis of A. fib who presents today for F/U EVAL OF NEW ONSET ATRIAL FIBRILLATION. He was initially seen on April 07, 2018 at the request of Dr. Harrington Challenger for recently diagnosed atrial fibrillation flutter.  We ordered an echocardiogram and a Myoview stress test.  Also recommended that he continue Eliquis.  MAXSON ODDO was last seen on 05/14/18 BY Almyra Deforest, PA-C - continued Eliquis; no BP medications added because of relative normal pressures at home.  Recent Hospitalizations: none  Studies Personally Reviewed - (if available, images/films reviewed: From Epic Chart or Care Everywhere)  ECHO 04/12/2018: EF 55%. Gr1 DD. Mod LVH - normal  MYOVIEW 04/21/18: No ischemia/Infarction.  (Not gated b/c Afib.)  Interval History: [name] returns here today more with questions about treatment for A. fib than anything else.  He really has no noted symptoms of being in atrial fibrillation.  He has not really noted any sensation of rapid irregular heartbeats or palpitations.  No syncope or near syncope.  No dyspnea with rest or exertion.  He has not had any chest tightness or pressure with rest or exertion.  No heart failure symptoms of PND, orthopnea or edema.  No TIA/amaurosis fugax symptoms.  No melena, hematochezia, hematuria, or epstaxis. No claudication.  ROS: A comprehensive was performed. Review of Systems  Constitutional: Negative for malaise/fatigue.  HENT: Negative for congestion.   Respiratory: Negative for cough, sputum production, shortness of breath and wheezing.   Gastrointestinal: Negative for abdominal pain and heartburn.  Musculoskeletal: Negative for joint pain.  Neurological: Negative for dizziness.    Psychiatric/Behavioral: Negative for memory loss. The patient is nervous/anxious (Still a bit nervous of his new diagnosis).   All other systems reviewed and are negative.   I have reviewed and (if needed) personally updated the patient's problem list, medications, allergies, past medical and surgical history, social and family history.   Past Medical History:  Diagnosis Date  . Anxiety   . Diabetes mellitus   . Hyperlipidemia   . Hypertension    pcp  dr Harrington Challenger   @ [facility]  . Paroxysmal atrial fibrillation ([place]) 03/2018   Rate controlled.  Asymptomatic; anticoagulated with Eliquis.  . Right rotator cuff tear     Past Surgical History:  Procedure Laterality Date  . HERNIA REPAIR  INFANT  . INGUINAL HERNIA REPAIR  09/10/2011   Procedure: HERNIA REPAIR INGUINAL ADULT;  Surgeon: Imogene Burn. Georgette Dover, MD;  Location: [facility];  Service: General;  Laterality: Left;  REPAIR LEFT HERNIA INGUINAL ADULT WITH MESH  . SHOULDER OPEN ROTATOR CUFF REPAIR Right 02/16/2013   Procedure: OPEN RIGHT SHOULDER ROTATOR CUFF REPAIR WITH GRAFT AND ANCHORS;  Surgeon: Tobi Bastos, MD;  Location: [facility];  Service: Orthopedics;  Laterality: Right;  . TONSILLECTOMY AND ADENOIDECTOMY  72 years old    Current Meds  Medication Sig  . apixaban (ELIQUIS) 5 MG TABS tablet Take 1 tablet (5 mg total) by mouth 2 (two) times daily.  Marland Kitchen atorvastatin (LIPITOR) 10 MG tablet Take 10 mg by mouth daily.    Marland Kitchen glimepiride (AMARYL) 1 MG tablet Take 1 mg by mouth daily with breakfast.  . lisinopril (PRINIVIL,ZESTRIL) 40  MG tablet Take 40 mg by mouth daily.    . metFORMIN (GLUCOPHAGE) 500 MG tablet Take 1,000 mg by mouth 2 (two) times daily with a meal.     No Known Allergies  Social History   Tobacco Use  . Smoking status: Former Smoker    Packs/day: 1.00    Years: 4.00    Pack years: 4.00    Types: Cigarettes    Start date: 1975    Last attempt to quit: 1979    Years since quitting: 40.7  . Smokeless  tobacco: Never Used  Substance Use Topics  . Alcohol use: Yes    Comment: socially - 1-7 / month  . Drug use: No   Social History   Social History Narrative   Married - 2 children (adopted) with 4 grandchildren.   Works in [place]    2 cups coffee / day, 1 of tea.   Trying to get back to exercise - always up & about, on feet.  May do exercise bike ~30 min or more.    family history includes Depression in his mother; Healthy in his brother; Lung cancer in his father.  Wt Readings from Last 3 Encounters:  06/21/18 153 lb (69.4 kg)  05/14/18 150 lb (68 kg)  04/21/18 150 lb (68 kg)    PHYSICAL EXAM BP (!) 152/76   Pulse 60   Ht 5\' 7"  (1.702 m)   Wt 153 lb (69.4 kg)   BMI 23.96 kg/m  Physical Exam  Constitutional: He is oriented to person, place, and time. He appears well-developed and well-nourished. No distress.  HENT:  Head: Normocephalic and atraumatic.  Neck: No hepatojugular reflux and no JVD present. Carotid bruit is not present.  Cardiovascular: Normal rate, regular rhythm, normal heart sounds, intact distal pulses and normal pulses.  No extrasystoles are present. PMI is not displaced. Exam reveals no gallop and no friction rub.  No murmur heard. Pulmonary/Chest: Effort normal and breath sounds normal. No respiratory distress. He has no wheezes. He has no rales.  Abdominal: Soft. Bowel sounds are normal. He exhibits no distension. There is no tenderness. There is no rebound.  Musculoskeletal: Normal range of motion. He exhibits no edema.  Neurological: He is alert and oriented to person, place, and time.  Psychiatric: He has a normal mood and affect. His behavior is normal. Judgment and thought content normal.  Vitals reviewed.    Adult ECG Report N/a  Other studies Reviewed: Additional studies/ records that were reviewed today include:  Recent Labs:  n/a  ASSESSMENT / PLAN: Problem List Items Addressed This Visit    Essential hypertension  (Chronic)    Blood pressure again high today.  Is always been better and his PCPs office and at home.  Usually in the 121 30 million mercury systolic. For now would simply continue lisinopril and monitor.  If pressure tends to increase, would then consider dihydropyridine calcium channel blocker versus HCTZ.  Apparently had been on HCTZ in the past, but was taken off it.  I therefore would prefer to allow his PCP to manage.  Would probably avoid AV nodal agents such as non-dihydropyridine calcium channel blockers (verapamil, diltiazem) and beta-blockers      Hyperlipidemia due to type 2 diabetes mellitus (HCC) (Chronic)    On low-dose statin.  Monitored by PCP.  Meets criteria for metabolic syndrome.      Metabolic syndrome (Chronic)    Blood pressure still little bit elevated.  We will need  to monitor.  He is on ACE inhibitor and statin as a diabetic with hyperlipidemia.  Continue to stay active.  Negative Myoview.      Paroxysmal atrial fibrillation ([place]): CHA2DS2-VASc Score = 3 - on Eliquis - Primary (Chronic)    Relatively asymptomatic A. fib that is rate controlled without AV nodal agent. --Since he is relatively a symptomatically has normal cardiac function, would simply monitor for symptoms and otherwise not treat since he is bradycardic at baseline. Relatively normal echocardiogram and Myoview.  Continue Eliquis frantic regulation.  I spent 25 out of 35 minutes with the patient discussing CHA2DS2Vasc & Has-Bled score.  I explained the concept of rate versus rhythm control and the importance of Eliquis.  He did not understand why he was taking a blood thinner for heart rhythm issue.   All questions were answered.  He fully understands now.  He agrees to take Eliquis now.        This patients CHA2DS2-VASc Score and unadjusted Ischemic Stroke Rate (% per year) is equal to 3.2 % stroke rate/year from a score of 3  Above score calculated as 1 point each if present [CHF, HTN, DM,  Vascular=MI/PAD/Aortic Plaque, Age if 65-74, or Male] Above score calculated as 2 points each if present [Age > 75, or Stroke/TIA/TE]   I spent a total of 35 minutes with the patient and chart review. >  50% of the time was spent in direct patient consultation.   Current medicines are reviewed at length with the patient today.  (+/- concerns) see below The following changes have been made:  no new meds (f/u BP with PCP)  Patient Instructions  NO CHANGES WITH MEDICATIONS   KEEP BLOOD PRESSURE LOG IF RUNNING HIGH  NOTIFY PRIMARY .    Your physician wants you to follow-up in [place]. You will receive a reminder letter in the mail two months in advance. If you don't receive a letter, please call our office to schedule the follow-up appointment.    If you need a refill on your cardiac medications before your next appointment, please call your pharmacy.       Studies Ordered:   No orders of the defined types were placed in this encounter.     Glenetta Hew, M.D., M.S. Interventional Cardiologist   Pager # [phone] Phone # [phone] [address] [place], [place] [address]   Thank you for choosing [facility] at [facility]!!

## 2018-06-22 ENCOUNTER — Encounter: Payer: Self-pay | Admitting: Cardiology

## 2018-06-22 NOTE — Assessment & Plan Note (Addendum)
Relatively asymptomatic A. fib that is rate controlled without AV nodal agent. --Since he is relatively a symptomatically has normal cardiac function, would simply monitor for symptoms and otherwise not treat since he is bradycardic at baseline. Relatively normal echocardiogram and Myoview.  Continue Eliquis frantic regulation.  I spent 25 out of 35 minutes with the patient discussing CHA2DS2Vasc & Has-Bled score.  I explained the concept of rate versus rhythm control and the importance of Eliquis.  He did not understand why he was taking a blood thinner for heart rhythm issue.   All questions were answered.  He fully understands now.  He agrees to take Eliquis now.

## 2018-06-22 NOTE — Assessment & Plan Note (Signed)
Blood pressure again high today.  Is always been better and his PCPs office and at home.  Usually in the 121 30 million mercury systolic. For now would simply continue lisinopril and monitor.  If pressure tends to increase, would then consider dihydropyridine calcium channel blocker versus HCTZ.  Apparently had been on HCTZ in the past, but was taken off it.  I therefore would prefer to allow his PCP to manage.  Would probably avoid AV nodal agents such as non-dihydropyridine calcium channel blockers (verapamil, diltiazem) and beta-blockers

## 2018-06-22 NOTE — Assessment & Plan Note (Signed)
Blood pressure still little bit elevated.  We will need to monitor.  He is on ACE inhibitor and statin as a diabetic with hyperlipidemia.  Continue to stay active.  Negative Myoview.

## 2018-06-22 NOTE — Assessment & Plan Note (Signed)
On low-dose statin.  Monitored by PCP.  Meets criteria for metabolic syndrome.

## 2018-08-20 DIAGNOSIS — M24549 Contracture, unspecified hand: Secondary | ICD-10-CM | POA: Diagnosis not present

## 2018-08-20 DIAGNOSIS — Z125 Encounter for screening for malignant neoplasm of prostate: Secondary | ICD-10-CM | POA: Diagnosis not present

## 2018-08-20 DIAGNOSIS — M542 Cervicalgia: Secondary | ICD-10-CM | POA: Diagnosis not present

## 2018-08-20 DIAGNOSIS — R972 Elevated prostate specific antigen [PSA]: Secondary | ICD-10-CM | POA: Diagnosis not present

## 2018-08-20 DIAGNOSIS — I10 Essential (primary) hypertension: Secondary | ICD-10-CM | POA: Diagnosis not present

## 2018-08-20 DIAGNOSIS — E1169 Type 2 diabetes mellitus with other specified complication: Secondary | ICD-10-CM | POA: Diagnosis not present

## 2018-08-30 DIAGNOSIS — E119 Type 2 diabetes mellitus without complications: Secondary | ICD-10-CM | POA: Diagnosis not present

## 2018-10-26 DIAGNOSIS — M720 Palmar fascial fibromatosis [Dupuytren]: Secondary | ICD-10-CM | POA: Diagnosis not present

## 2018-10-26 DIAGNOSIS — M79642 Pain in left hand: Secondary | ICD-10-CM | POA: Insufficient documentation

## 2018-12-02 DIAGNOSIS — S0101XA Laceration without foreign body of scalp, initial encounter: Secondary | ICD-10-CM | POA: Diagnosis not present

## 2019-07-13 DIAGNOSIS — M722 Plantar fascial fibromatosis: Secondary | ICD-10-CM | POA: Diagnosis not present

## 2019-07-13 DIAGNOSIS — R972 Elevated prostate specific antigen [PSA]: Secondary | ICD-10-CM | POA: Diagnosis not present

## 2019-07-13 DIAGNOSIS — E1169 Type 2 diabetes mellitus with other specified complication: Secondary | ICD-10-CM | POA: Diagnosis not present

## 2019-07-13 DIAGNOSIS — M7731 Calcaneal spur, right foot: Secondary | ICD-10-CM | POA: Diagnosis not present

## 2019-07-13 DIAGNOSIS — Z125 Encounter for screening for malignant neoplasm of prostate: Secondary | ICD-10-CM | POA: Diagnosis not present

## 2019-07-13 DIAGNOSIS — E782 Mixed hyperlipidemia: Secondary | ICD-10-CM | POA: Diagnosis not present

## 2019-07-13 DIAGNOSIS — M71571 Other bursitis, not elsewhere classified, right ankle and foot: Secondary | ICD-10-CM | POA: Diagnosis not present

## 2019-07-13 DIAGNOSIS — E119 Type 2 diabetes mellitus without complications: Secondary | ICD-10-CM | POA: Diagnosis not present

## 2019-07-13 DIAGNOSIS — I10 Essential (primary) hypertension: Secondary | ICD-10-CM | POA: Diagnosis not present

## 2019-07-20 DIAGNOSIS — M71571 Other bursitis, not elsewhere classified, right ankle and foot: Secondary | ICD-10-CM | POA: Diagnosis not present

## 2019-07-20 DIAGNOSIS — M722 Plantar fascial fibromatosis: Secondary | ICD-10-CM | POA: Diagnosis not present

## 2019-07-28 DIAGNOSIS — E1169 Type 2 diabetes mellitus with other specified complication: Secondary | ICD-10-CM | POA: Diagnosis not present

## 2019-07-28 DIAGNOSIS — E782 Mixed hyperlipidemia: Secondary | ICD-10-CM | POA: Diagnosis not present

## 2019-07-28 DIAGNOSIS — E119 Type 2 diabetes mellitus without complications: Secondary | ICD-10-CM | POA: Diagnosis not present

## 2019-07-28 DIAGNOSIS — I10 Essential (primary) hypertension: Secondary | ICD-10-CM | POA: Diagnosis not present

## 2019-08-03 DIAGNOSIS — M71571 Other bursitis, not elsewhere classified, right ankle and foot: Secondary | ICD-10-CM | POA: Diagnosis not present

## 2019-08-03 DIAGNOSIS — M722 Plantar fascial fibromatosis: Secondary | ICD-10-CM | POA: Diagnosis not present

## 2019-10-12 DIAGNOSIS — E782 Mixed hyperlipidemia: Secondary | ICD-10-CM | POA: Diagnosis not present

## 2019-10-12 DIAGNOSIS — I10 Essential (primary) hypertension: Secondary | ICD-10-CM | POA: Diagnosis not present

## 2019-10-12 DIAGNOSIS — E119 Type 2 diabetes mellitus without complications: Secondary | ICD-10-CM | POA: Diagnosis not present

## 2019-10-12 DIAGNOSIS — E1169 Type 2 diabetes mellitus with other specified complication: Secondary | ICD-10-CM | POA: Diagnosis not present

## 2019-11-04 DIAGNOSIS — E1169 Type 2 diabetes mellitus with other specified complication: Secondary | ICD-10-CM | POA: Diagnosis not present

## 2019-11-04 DIAGNOSIS — E119 Type 2 diabetes mellitus without complications: Secondary | ICD-10-CM | POA: Diagnosis not present

## 2019-11-04 DIAGNOSIS — I10 Essential (primary) hypertension: Secondary | ICD-10-CM | POA: Diagnosis not present

## 2019-11-04 DIAGNOSIS — E782 Mixed hyperlipidemia: Secondary | ICD-10-CM | POA: Diagnosis not present

## 2019-11-08 DIAGNOSIS — L570 Actinic keratosis: Secondary | ICD-10-CM | POA: Diagnosis not present

## 2019-11-08 DIAGNOSIS — D485 Neoplasm of uncertain behavior of skin: Secondary | ICD-10-CM | POA: Diagnosis not present

## 2020-01-04 DIAGNOSIS — J0120 Acute ethmoidal sinusitis, unspecified: Secondary | ICD-10-CM | POA: Diagnosis not present

## 2020-01-04 DIAGNOSIS — I10 Essential (primary) hypertension: Secondary | ICD-10-CM | POA: Diagnosis not present

## 2020-01-04 DIAGNOSIS — E1169 Type 2 diabetes mellitus with other specified complication: Secondary | ICD-10-CM | POA: Diagnosis not present

## 2020-01-04 DIAGNOSIS — E782 Mixed hyperlipidemia: Secondary | ICD-10-CM | POA: Diagnosis not present

## 2020-01-04 DIAGNOSIS — Z7984 Long term (current) use of oral hypoglycemic drugs: Secondary | ICD-10-CM | POA: Diagnosis not present

## 2020-01-05 DIAGNOSIS — E782 Mixed hyperlipidemia: Secondary | ICD-10-CM | POA: Diagnosis not present

## 2020-01-05 DIAGNOSIS — E1169 Type 2 diabetes mellitus with other specified complication: Secondary | ICD-10-CM | POA: Diagnosis not present

## 2020-01-05 DIAGNOSIS — I10 Essential (primary) hypertension: Secondary | ICD-10-CM | POA: Diagnosis not present

## 2020-01-11 DIAGNOSIS — Z0000 Encounter for general adult medical examination without abnormal findings: Secondary | ICD-10-CM | POA: Diagnosis not present

## 2020-01-11 DIAGNOSIS — Z125 Encounter for screening for malignant neoplasm of prostate: Secondary | ICD-10-CM | POA: Diagnosis not present

## 2020-01-11 DIAGNOSIS — E1169 Type 2 diabetes mellitus with other specified complication: Secondary | ICD-10-CM | POA: Diagnosis not present

## 2020-01-11 DIAGNOSIS — I4892 Unspecified atrial flutter: Secondary | ICD-10-CM | POA: Diagnosis not present

## 2020-01-11 DIAGNOSIS — D6869 Other thrombophilia: Secondary | ICD-10-CM | POA: Diagnosis not present

## 2020-01-11 DIAGNOSIS — R972 Elevated prostate specific antigen [PSA]: Secondary | ICD-10-CM | POA: Diagnosis not present

## 2020-01-11 DIAGNOSIS — I10 Essential (primary) hypertension: Secondary | ICD-10-CM | POA: Diagnosis not present

## 2020-01-11 DIAGNOSIS — E782 Mixed hyperlipidemia: Secondary | ICD-10-CM | POA: Diagnosis not present

## 2020-01-23 DIAGNOSIS — H2513 Age-related nuclear cataract, bilateral: Secondary | ICD-10-CM | POA: Diagnosis not present

## 2020-01-23 DIAGNOSIS — H5203 Hypermetropia, bilateral: Secondary | ICD-10-CM | POA: Diagnosis not present

## 2020-01-23 DIAGNOSIS — H524 Presbyopia: Secondary | ICD-10-CM | POA: Diagnosis not present

## 2020-01-23 DIAGNOSIS — E119 Type 2 diabetes mellitus without complications: Secondary | ICD-10-CM | POA: Diagnosis not present

## 2020-02-01 ENCOUNTER — Ambulatory Visit: Payer: PPO | Admitting: Cardiology

## 2020-02-01 ENCOUNTER — Encounter: Payer: Self-pay | Admitting: Cardiology

## 2020-02-01 ENCOUNTER — Other Ambulatory Visit: Payer: Self-pay

## 2020-02-01 VITALS — BP 132/85 | HR 60 | Temp 98.0°F | Resp 18 | Ht 67.0 in | Wt 151.6 lb

## 2020-02-01 DIAGNOSIS — I480 Paroxysmal atrial fibrillation: Secondary | ICD-10-CM

## 2020-02-01 DIAGNOSIS — E785 Hyperlipidemia, unspecified: Secondary | ICD-10-CM | POA: Diagnosis not present

## 2020-02-01 DIAGNOSIS — E1169 Type 2 diabetes mellitus with other specified complication: Secondary | ICD-10-CM

## 2020-02-01 DIAGNOSIS — I10 Essential (primary) hypertension: Secondary | ICD-10-CM

## 2020-02-01 NOTE — Progress Notes (Signed)
Primary Care Provider: Lawerance Cruel, MD Cardiologist: No primary care provider on file. Electrophysiologist: None  Clinic Note: Chief Complaint  Patient presents with  . Follow-up    Has been ~1 1/2 years  . Atrial Fibrillation    Asymptomatic.  Has not had any recurrent episodes   HPI:    Daniel Taylor is a 74 y.o. male with a history of ATRIAL FIBRILLATION diagnosed in the fall of 2019 along with HYPERTENSION, HYPERLIPIDEMIA and DIABETES MELLITUS 2 below who presents today for 19 month f/.u.  Daniel Taylor was last seen on June 21, 2018 as a follow-up from initial evaluation in the hospital he was diagnosed with atrial fibrillation/flutter in June 2019.  At that he saw Almyra Deforest, [place] in August 2019, started on Eliquis and discussed results of his echocardiogram Myoview noted below. --> When I saw him he had several questions but no real symptoms.  No suggestion of having been in A. fib.  No chest pain or pressure.  Recent Hospitalizations: None  Reviewed  CV studies:    The following studies were reviewed today: (if available, images/films reviewed: From Epic Chart or Care Everywhere) . None since September 2019  Interval History:   Daniel Taylor returns today for what amounts to be 2-year follow-up really without any symptoms.  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  He actually says that with the [employer] Covid vaccine teen out at the mall, he has had quite a boost in business between all of the linen from the hotels where the [place] are staying as well as their uniforms.  He really has had no sense whatsoever of whether he is or is not in A. fib.  He has not had any dizziness or wooziness.  No dyspnea.  No chest tightness or pressure.  He certainly does not feel that his heart rate is ever gone fast, but is not limited as far as activity goes.  No fatigue.  He is doing well with Eliquis.  No bleeding issues.  CV Review of  Symptoms (Summary) Cardiovascular ROS: no chest pain or dyspnea on exertion negative for - edema, irregular heartbeat, orthopnea, palpitations, paroxysmal nocturnal dyspnea, rapid heart rate, shortness of breath or Syncope/near syncope, TIA/amaurosis fugax.  Claudication  The patient does not have symptoms concerning for COVID-19 infection (fever, chills, cough, or new shortness of breath).  The patient is practicing social distancing & Masking.    REVIEWED OF SYSTEMS   Review of Systems  Constitutional: Negative for malaise/fatigue and weight loss.  HENT: Negative for nosebleeds.   Respiratory: Negative for shortness of breath.   Cardiovascular: Negative for palpitations.  Gastrointestinal: Negative for blood in stool.  Genitourinary: Negative for flank pain and hematuria.  Musculoskeletal: Negative for falls and joint pain.  Neurological: Negative for dizziness and headaches.  Endo/Heme/Allergies: Does not bruise/bleed easily.  Psychiatric/Behavioral: The patient is not nervous/anxious (Notably less anxious than last visit).    I have reviewed and (if needed) personally updated the patient's problem list, medications, allergies, past medical and surgical history, social and family history.   PAST MEDICAL HISTORY   Past Medical History:  Diagnosis Date  . Anxiety   . Diabetes mellitus   . Hyperlipidemia   . Hypertension    pcp  dr Harrington Challenger   @ [facility]  . Paroxysmal atrial fibrillation ([place]) 03/2018   Rate controlled.  Asymptomatic; anticoagulated with Eliquis.  . Right rotator cuff tear  PAST SURGICAL HISTORY   Past Surgical History:  Procedure Laterality Date  . HERNIA REPAIR  INFANT  . INGUINAL HERNIA REPAIR  09/10/2011   Procedure: HERNIA REPAIR INGUINAL ADULT;  Surgeon: Imogene Burn. Georgette Dover, MD;  Location: [facility];  Service: General;  Laterality: Left;  REPAIR LEFT HERNIA INGUINAL ADULT WITH MESH  . NM MYOVIEW LTD  04/2018    No ischemia/Infarction.  (Not gated b/c Afib.)   . SHOULDER OPEN ROTATOR CUFF REPAIR Right 02/16/2013   Procedure: OPEN RIGHT SHOULDER ROTATOR CUFF REPAIR WITH GRAFT AND ANCHORS;  Surgeon: Tobi Bastos, MD;  Location: [facility];  Service: Orthopedics;  Laterality: Right;  . TONSILLECTOMY AND ADENOIDECTOMY  74 years old  . TRANSTHORACIC ECHOCARDIOGRAM  04/2018   EF 55%. Gr1 DD. Mod LVH - normal    MEDICATIONS/ALLERGIES   Current Meds  Medication Sig  . apixaban (ELIQUIS) 5 MG TABS tablet Take 1 tablet (5 mg total) by mouth 2 (two) times daily.  Marland Kitchen atorvastatin (LIPITOR) 10 MG tablet Take 10 mg by mouth daily.    Marland Kitchen glimepiride (AMARYL) 1 MG tablet Take 1 mg by mouth daily with breakfast.  . lisinopril (PRINIVIL,ZESTRIL) 40 MG tablet Take 40 mg by mouth daily.    . metFORMIN (GLUCOPHAGE) 500 MG tablet Take 1,000 mg by mouth 2 (two) times daily with a meal.   . NON FORMULARY OneTouch Delica Lancets 33 gauge  . NON FORMULARY OneTouch Verio test strips  . NON FORMULARY OneTouch Verio Meter    No Known Allergies  SOCIAL HISTORY/FAMILY HISTORY   Reviewed in Epic:  Pertinent findings: Still working in [employer] business.  OBJCTIVE -PE, EKG, labs   Wt Readings from Last 3 Encounters:  02/01/20 151 lb 9.6 oz (68.8 kg)  06/21/18 153 lb (69.4 kg)  05/14/18 150 lb (68 kg)    Physical Exam: BP 132/85   Pulse 60   Temp 98 F (36.7 C)   Resp 18   Ht 5\' 7"  (1.702 m)   Wt 151 lb 9.6 oz (68.8 kg)   SpO2 96%   BMI 23.74 kg/m  Physical Exam  Constitutional: He is oriented to person, place, and time. He appears well-developed and well-nourished. No distress.  HENT:  Head: Normocephalic and atraumatic.  Neck: No hepatojugular reflux and no JVD present. Carotid bruit is not present.  Cardiovascular: Normal rate, regular rhythm, normal heart sounds and intact distal pulses.  No extrasystoles are present. PMI is not displaced. Exam reveals no gallop and no friction rub.  No murmur heard. Pulmonary/Chest: Effort  normal and breath sounds normal. No respiratory distress. He has no wheezes. He has no rales.  Musculoskeletal:        General: No edema. Normal range of motion.     Cervical back: Normal range of motion and neck supple.  Neurological: He is alert and oriented to person, place, and time.  Psychiatric: He has a normal mood and affect. His behavior is normal. Judgment and thought content normal.  Vitals reviewed.    Adult ECG Report  Rate: 58 ;  Rhythm: sinus bradycardia and Normal axis, intervals and durations.;   Narrative Interpretation: Normal EKG.  Recent Labs:    January 11, 2020: TC 139, TG 82, HDL 62, LDL 71.  A1c 7.0.  Hgb 15.4, Cr 0.94, K+ 4.3.  TSH 2.14. No results found for: CHOL, HDL, LDLCALC, LDLDIRECT, TRIG, CHOLHDL Lab Results  Component Value Date   CREATININE 0.59 02/16/2013   BUN 17  02/16/2013   NA 137 02/16/2013   K 4.0 02/16/2013   CL 101 02/16/2013   CO2 26 02/16/2013   No results found for: TSH  ASSESSMENT/PLAN    Problem List Items Addressed This Visit    Paroxysmal atrial fibrillation ([place]): CHA2DS2-VASc Score = 3 - on Eliquis - Primary (Chronic)    He was pretty asymptomatic when he was in A. fib.  Rate controlled at baseline without being on beta-blocker.  For now maintaining anticoagulation with Eliquis and no bleeding issues.  Continue to monitor.  Okay to hold Eliquis for 2 days for most procedures and for any neurologic procedures 3 to 4 days.      Relevant Orders   EKG 12-Lead (Completed)   Hyperlipidemia due to type 2 diabetes mellitus ([place]) (Chronic)    Labs monitored by PCP.  Well-controlled based on recent studies.  On 10 mg atorvastatin.  Tolerating well.  His A1c still a little bit higher than one would like.  Is only on Amaryl and Metformin.  Defer to PCP.      Essential hypertension (Chronic)    Blood pressure would currently be consistent with class I hypertension based on new criteria today.  He is on lisinopril alone.  Seems to be  doing relatively well.  I do not really see a reason to add new medication since he is so active and tells me at home his blood pressures are usually better than this.  Defer titration to PCP who sees him more frequently.          COVID-19 Education: The signs and symptoms of COVID-19 were discussed with the patient and how to seek care for testing (follow up with PCP or arrange E-visit).   The importance of social distancing and COVID-19 vaccination was discussed today.  I spent a total of 15 minutes with the patient. >  50% of the time was spent in direct patient consultation.  Additional time spent with chart review  / charting (studies, outside notes, etc): 6 Total Time: 51min   Current medicines are reviewed at length with the patient today.  (+/- concerns) none  Notice: This dictation was prepared with Dragon dictation along with smaller phrase technology. Any transcriptional errors that result from this process are unintentional and may not be corrected upon review.  Patient Instructions / Medication Changes & Studies & Tests Ordered   Patient Instructions  Medication Instructions:  The current medical regimen is effective;  continue present plan and medications as directed. Please refer to the Current Medication list given to you today. *If you need a refill on your cardiac medications before your next appointment, please call your pharmacy*  Follow-Up: Your next appointment:  12 month(s) Please call our office 2 months in advance to schedule this appointment In Person with You may see Berdina Cheever Ellyn Hack, MD or one of the following Advanced Practice Providers on your designated Care Team:  Rosaria Ferries, PA-C Jory Sims, DNP, ANP Cadence Kathlen Mody, NP.  At [facility], you and your health needs are our priority.  As part of our continuing mission to provide you with exceptional heart care, we have created designated Provider Care Teams.  These Care Teams include your primary  Cardiologist (physician) and Advanced Practice Providers (APPs -  Physician Assistants and Nurse Practitioners) who all work together to provide you with the care you need, when you need it.      Studies Ordered:   Orders Placed This Encounter  Procedures  . EKG  12-Lead     Glenetta Hew, M.D., M.S. Interventional Cardiologist   Pager # [phone] Phone # [phone] [address] [place], [place] [address]   Thank you for choosing Heartcare at [facility]!!

## 2020-02-01 NOTE — Patient Instructions (Addendum)
Medication Instructions:  The current medical regimen is effective;  continue present plan and medications as directed. Please refer to the Current Medication list given to you today. *If you need a refill on your cardiac medications before your next appointment, please call your pharmacy*  Follow-Up: Your next appointment:  12 month(s) Please call our office 2 months in advance to schedule this appointment In Person with You may see DAVID Ellyn Hack, MD or one of the following Advanced Practice Providers on your designated Care Team:  Rosaria Ferries, PA-C Jory Sims, DNP, ANP Cadence Kathlen Mody, NP.  At [facility], you and your health needs are our priority.  As part of our continuing mission to provide you with exceptional heart care, we have created designated Provider Care Teams.  These Care Teams include your primary Cardiologist (physician) and Advanced Practice Providers (APPs -  Physician Assistants and Nurse Practitioners) who all work together to provide you with the care you need, when you need it.

## 2020-02-05 ENCOUNTER — Encounter: Payer: Self-pay | Admitting: Cardiology

## 2020-02-05 NOTE — Assessment & Plan Note (Addendum)
Labs monitored by PCP.  Well-controlled based on recent studies.  On 10 mg atorvastatin.  Tolerating well.  His A1c still a little bit higher than one would like.  Is only on Amaryl and Metformin.  Defer to PCP.

## 2020-02-05 NOTE — Assessment & Plan Note (Signed)
Blood pressure would currently be consistent with class I hypertension based on new criteria today.  He is on lisinopril alone.  Seems to be doing relatively well.  I do not really see a reason to add new medication since he is so active and tells me at home his blood pressures are usually better than this.  Defer titration to PCP who sees him more frequently.

## 2020-02-05 NOTE — Assessment & Plan Note (Signed)
He was pretty asymptomatic when he was in A. fib.  Rate controlled at baseline without being on beta-blocker.  For now maintaining anticoagulation with Eliquis and no bleeding issues.  Continue to monitor.  Okay to hold Eliquis for 2 days for most procedures and for any neurologic procedures 3 to 4 days.

## 2020-03-02 DIAGNOSIS — E1169 Type 2 diabetes mellitus with other specified complication: Secondary | ICD-10-CM | POA: Diagnosis not present

## 2020-03-02 DIAGNOSIS — E782 Mixed hyperlipidemia: Secondary | ICD-10-CM | POA: Diagnosis not present

## 2020-03-02 DIAGNOSIS — I10 Essential (primary) hypertension: Secondary | ICD-10-CM | POA: Diagnosis not present

## 2020-03-27 DIAGNOSIS — E1169 Type 2 diabetes mellitus with other specified complication: Secondary | ICD-10-CM | POA: Diagnosis not present

## 2020-03-27 DIAGNOSIS — I10 Essential (primary) hypertension: Secondary | ICD-10-CM | POA: Diagnosis not present

## 2020-03-27 DIAGNOSIS — E782 Mixed hyperlipidemia: Secondary | ICD-10-CM | POA: Diagnosis not present

## 2020-04-17 DIAGNOSIS — E1169 Type 2 diabetes mellitus with other specified complication: Secondary | ICD-10-CM | POA: Diagnosis not present

## 2020-04-17 DIAGNOSIS — R197 Diarrhea, unspecified: Secondary | ICD-10-CM | POA: Diagnosis not present

## 2020-06-25 DIAGNOSIS — I10 Essential (primary) hypertension: Secondary | ICD-10-CM | POA: Diagnosis not present

## 2020-06-25 DIAGNOSIS — E1169 Type 2 diabetes mellitus with other specified complication: Secondary | ICD-10-CM | POA: Diagnosis not present

## 2020-06-25 DIAGNOSIS — E782 Mixed hyperlipidemia: Secondary | ICD-10-CM | POA: Diagnosis not present

## 2020-07-19 DIAGNOSIS — E1169 Type 2 diabetes mellitus with other specified complication: Secondary | ICD-10-CM | POA: Diagnosis not present

## 2020-07-19 DIAGNOSIS — R972 Elevated prostate specific antigen [PSA]: Secondary | ICD-10-CM | POA: Diagnosis not present

## 2020-08-14 DIAGNOSIS — H9203 Otalgia, bilateral: Secondary | ICD-10-CM | POA: Diagnosis not present

## 2020-08-14 DIAGNOSIS — R0981 Nasal congestion: Secondary | ICD-10-CM | POA: Diagnosis not present

## 2020-09-14 DIAGNOSIS — E1169 Type 2 diabetes mellitus with other specified complication: Secondary | ICD-10-CM | POA: Diagnosis not present

## 2020-09-14 DIAGNOSIS — I10 Essential (primary) hypertension: Secondary | ICD-10-CM | POA: Diagnosis not present

## 2020-09-14 DIAGNOSIS — E782 Mixed hyperlipidemia: Secondary | ICD-10-CM | POA: Diagnosis not present

## 2020-11-14 DIAGNOSIS — E782 Mixed hyperlipidemia: Secondary | ICD-10-CM | POA: Diagnosis not present

## 2020-11-14 DIAGNOSIS — I10 Essential (primary) hypertension: Secondary | ICD-10-CM | POA: Diagnosis not present

## 2020-11-14 DIAGNOSIS — E1169 Type 2 diabetes mellitus with other specified complication: Secondary | ICD-10-CM | POA: Diagnosis not present

## 2020-12-13 DIAGNOSIS — E782 Mixed hyperlipidemia: Secondary | ICD-10-CM | POA: Diagnosis not present

## 2020-12-13 DIAGNOSIS — I10 Essential (primary) hypertension: Secondary | ICD-10-CM | POA: Diagnosis not present

## 2020-12-13 DIAGNOSIS — E1169 Type 2 diabetes mellitus with other specified complication: Secondary | ICD-10-CM | POA: Diagnosis not present

## 2020-12-19 DIAGNOSIS — M25571 Pain in right ankle and joints of right foot: Secondary | ICD-10-CM | POA: Diagnosis not present

## 2020-12-28 DIAGNOSIS — M19071 Primary osteoarthritis, right ankle and foot: Secondary | ICD-10-CM | POA: Diagnosis not present

## 2020-12-28 DIAGNOSIS — M109 Gout, unspecified: Secondary | ICD-10-CM | POA: Diagnosis not present

## 2020-12-28 DIAGNOSIS — M67371 Transient synovitis, right ankle and foot: Secondary | ICD-10-CM | POA: Diagnosis not present

## 2020-12-31 DIAGNOSIS — M67371 Transient synovitis, right ankle and foot: Secondary | ICD-10-CM | POA: Diagnosis not present

## 2021-01-07 DIAGNOSIS — M7751 Other enthesopathy of right foot: Secondary | ICD-10-CM | POA: Diagnosis not present

## 2021-01-07 DIAGNOSIS — M67371 Transient synovitis, right ankle and foot: Secondary | ICD-10-CM | POA: Diagnosis not present

## 2021-01-16 DIAGNOSIS — Z125 Encounter for screening for malignant neoplasm of prostate: Secondary | ICD-10-CM | POA: Diagnosis not present

## 2021-01-16 DIAGNOSIS — I4892 Unspecified atrial flutter: Secondary | ICD-10-CM | POA: Diagnosis not present

## 2021-01-16 DIAGNOSIS — I10 Essential (primary) hypertension: Secondary | ICD-10-CM | POA: Diagnosis not present

## 2021-01-16 DIAGNOSIS — Z0000 Encounter for general adult medical examination without abnormal findings: Secondary | ICD-10-CM | POA: Diagnosis not present

## 2021-01-16 DIAGNOSIS — D6869 Other thrombophilia: Secondary | ICD-10-CM | POA: Diagnosis not present

## 2021-01-16 DIAGNOSIS — E1169 Type 2 diabetes mellitus with other specified complication: Secondary | ICD-10-CM | POA: Diagnosis not present

## 2021-01-16 DIAGNOSIS — B351 Tinea unguium: Secondary | ICD-10-CM | POA: Diagnosis not present

## 2021-01-16 DIAGNOSIS — E782 Mixed hyperlipidemia: Secondary | ICD-10-CM | POA: Diagnosis not present

## 2021-01-21 DIAGNOSIS — M67371 Transient synovitis, right ankle and foot: Secondary | ICD-10-CM | POA: Diagnosis not present

## 2021-01-21 DIAGNOSIS — M7751 Other enthesopathy of right foot: Secondary | ICD-10-CM | POA: Diagnosis not present

## 2021-02-06 DIAGNOSIS — I10 Essential (primary) hypertension: Secondary | ICD-10-CM | POA: Diagnosis not present

## 2021-02-20 DIAGNOSIS — I10 Essential (primary) hypertension: Secondary | ICD-10-CM | POA: Diagnosis not present

## 2021-02-21 ENCOUNTER — Other Ambulatory Visit: Payer: Self-pay

## 2021-02-21 ENCOUNTER — Encounter: Payer: Self-pay | Admitting: Cardiology

## 2021-02-21 ENCOUNTER — Ambulatory Visit: Payer: PPO | Admitting: Cardiology

## 2021-02-21 VITALS — BP 138/88 | HR 65 | Ht 65.0 in | Wt 151.2 lb

## 2021-02-21 DIAGNOSIS — E785 Hyperlipidemia, unspecified: Secondary | ICD-10-CM

## 2021-02-21 DIAGNOSIS — E8881 Metabolic syndrome: Secondary | ICD-10-CM

## 2021-02-21 DIAGNOSIS — I10 Essential (primary) hypertension: Secondary | ICD-10-CM | POA: Diagnosis not present

## 2021-02-21 DIAGNOSIS — I480 Paroxysmal atrial fibrillation: Secondary | ICD-10-CM | POA: Diagnosis not present

## 2021-02-21 DIAGNOSIS — E1169 Type 2 diabetes mellitus with other specified complication: Secondary | ICD-10-CM | POA: Diagnosis not present

## 2021-02-21 NOTE — Progress Notes (Signed)
Primary Care Provider: Lawerance Cruel, MD Cardiologist: Glenetta Hew, MD Electrophysiologist: None  Clinic Note: Chief Complaint  Patient presents with  . Follow-up    1 year.  . Atrial Fibrillation    Unaware of being in A. fib.  Marland Kitchen Hypertension    Blood pressure has been ranging a little bit higher than usual.  PCP started amlodipine, titrated up to 5 mg.   ===================================  ASSESSMENT/PLAN   Problem List Items Addressed This Visit    Paroxysmal atrial fibrillation ([place]): CHA2DS2-VASc Score = 3 - on Eliquis - Primary (Chronic)    He is currently in A. fib, and remains totally asymptomatic.  His rate is well controlled without AV nodal agents.  If any, would monitor for bradycardia.  Doing well with apixaban.  No bleeding issues.      Relevant Orders   EKG 12-Lead (Completed)   Hyperlipidemia due to type 2 diabetes mellitus ([place]) (Chronic)    Excellent control as of April.  Being monitored by PCP.  He is on low-dose atorvastatin and doing well.  A1c was 7.6 on last check on combination metformin and glimepiride.  He has been increased to 1000 mg twice daily metformin.  Being managed by PCP.      Essential hypertension (Chronic)    His blood pressure is borderline high today.  Was recently started on amlodipine up to 5 mg along with his lisinopril 40 mg.  He can probably tolerate increasing amlodipine all the way up to 10 mg, but this was just a recent change.  Will defer to PCP.      Metabolic syndrome (Chronic)    With hypertension, diabetes and hyperlipidemia he is somewhat borderline for metabolic syndrome as his HDL is in the 60s and triglycerides have been controlled.    regardless, he is still at risk for CAD.  He had a negative Myoview and evaluation for etiology for A. Fib.  No active symptoms. Recommend that he continue to stay active.  Continue risk factor modification.         ===================================  HPI:     Daniel Taylor is a 75 y.o. male with a PMH notable for PAROXYSMAL ATRIAL FIBRILLATION (diagnosed fall 2019), HTN, HLD and DM-2 who presents today for annual follow-up.  JOSUHA FONTANEZ was last seen on February 01, 2020 for almost 30-month follow-up indicating that he had not any breakthrough episodes of A. fib.  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  He had a significant boost to business when the [employer] were all staying in the nearby hotels As Part of the [place] service at the [facility].  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  He had not had any breakthrough episodes of A. fib.  No bleeding issues with Eliquis.  Recent Hospitalizations: None  Reviewed  CV studies:    The following studies were reviewed today: (if available, images/films reviewed: From Epic Chart or Care Everywhere) . None:  Interval History:   Daniel Taylor presents here today for in person evaluation doing quite well.  He tells me that about the only thing that he is notices that he has been following his blood pressures a little more closely and his PCP checked his pressure about a month ago it was 140s over 90s on 2 occasions.  He is not really sure what is going on, but at home the pressure is  not as high as it had been.  Apparently his PCP started amlodipine at 2.5 mg and increase it to 5 mg.  He has not had a break to episodes of A. fib that he can tell, but is unaware of when he is or is not in a day.  He is not aware that he is currently in A. fib.  No signs of fatigue or exertional dyspnea.  Otherwise asymptomatic from cardiac standpoint.  May be some mild trivial edema from the amlodipine but otherwise doing well.  No bleeding issues on Eliquis  Cardiovascular Review of Symptoms (Summary): no chest pain or dyspnea on exertion positive for - edema and This is only minimal negative for - irregular  heartbeat, murmur, orthopnea, palpitations, paroxysmal nocturnal dyspnea, rapid heart rate, shortness of breath or Lightheadedness, dizziness, wooziness or syncope/near syncope, TIA/amaurosis fugax, claudication.  Headaches as blurred vision  REVIEWED OF SYSTEMS   Review of Systems  Constitutional: Negative for malaise/fatigue and weight loss.  HENT: Negative for congestion and nosebleeds.   Respiratory: Negative for cough, shortness of breath and wheezing.   Gastrointestinal: Negative for abdominal pain and blood in stool.  Genitourinary: Negative for hematuria.  Musculoskeletal: Negative for joint pain and myalgias.  Neurological: Negative for dizziness and focal weakness.  Psychiatric/Behavioral: Negative.     I have reviewed and (if needed) personally updated the patient's problem list, medications, allergies, past medical and surgical history, social and family history.   PAST MEDICAL HISTORY   Past Medical History:  Diagnosis Date  . Anxiety   . Diabetes mellitus   . Hyperlipidemia   . Hypertension    pcp  dr Harrington Challenger   @ [facility]  . Paroxysmal atrial fibrillation ([place]) 03/2018   Rate controlled.  Asymptomatic; anticoagulated with Eliquis.  . Right rotator cuff tear     PAST SURGICAL HISTORY   Past Surgical History:  Procedure Laterality Date  . HERNIA REPAIR  INFANT  . INGUINAL HERNIA REPAIR  09/10/2011   Procedure: HERNIA REPAIR INGUINAL ADULT;  Surgeon: Imogene Burn. Georgette Dover, MD;  Location: [facility];  Service: General;  Laterality: Left;  REPAIR LEFT HERNIA INGUINAL ADULT WITH MESH  . NM MYOVIEW LTD  04/2018    No ischemia/Infarction.  (Not gated b/c Afib.)  . SHOULDER OPEN ROTATOR CUFF REPAIR Right 02/16/2013   Procedure: OPEN RIGHT SHOULDER ROTATOR CUFF REPAIR WITH GRAFT AND ANCHORS;  Surgeon: Tobi Bastos, MD;  Location: [facility];  Service: Orthopedics;  Laterality: Right;  . TONSILLECTOMY AND ADENOIDECTOMY  76 years old  . TRANSTHORACIC ECHOCARDIOGRAM   04/2018   EF 55%. Gr1 DD. Mod LVH - normal     There is no immunization history on file for this patient.  MEDICATIONS/ALLERGIES   No outpatient medications have been marked as taking for the 02/21/21 encounter (Office Visit) with Leonie Man, MD.    No Known Allergies  SOCIAL HISTORY/FAMILY HISTORY   Reviewed in Epic:  Pertinent findings:  Social History   Tobacco Use  . Smoking status: Former Smoker    Packs/day: 1.00    Years: 4.00    Pack years: 4.00    Types: Cigarettes    Start date: 28    Quit date: 1979    Years since quitting: 43.4  . Smokeless tobacco: Never Used  Substance Use Topics  . Alcohol use: Yes    Comment: socially - 1-7 / month  . Drug use: No   Social History   Social History  Narrative   Married - 2 children (adopted) with 4 grandchildren.   Works in [place]    2 cups coffee / day, 1 of tea.   Trying to get back to exercise - always up & about, on feet.  May do exercise bike ~30 min or more.    OBJCTIVE -PE, EKG, labs   Wt Readings from Last 3 Encounters:  02/21/21 151 lb 3.2 oz (68.6 kg)  02/01/20 151 lb 9.6 oz (68.8 kg)  06/21/18 153 lb (69.4 kg)    Physical Exam: BP 138/88   Pulse 65   Ht 5\' 5"  (1.651 m)   Wt 151 lb 3.2 oz (68.6 kg)   SpO2 98%   BMI 25.16 kg/m  Physical Exam Vitals reviewed.  Constitutional:      General: He is not in acute distress.    Appearance: Normal appearance. He is normal weight. He is not ill-appearing or toxic-appearing.  HENT:     Head: Normocephalic and atraumatic.  Neck:     Vascular: No carotid bruit or JVD.  Cardiovascular:     Rate and Rhythm: Normal rate. Rhythm irregularly irregular. Occasional extrasystoles are present.    Chest Wall: PMI is not displaced.     Pulses: Normal pulses.     Heart sounds: Normal heart sounds. No murmur heard. No friction rub. No gallop.   Pulmonary:     Effort: Pulmonary effort is normal. No respiratory distress.     Breath sounds:  Normal breath sounds. No rhonchi or rales.  Chest:     Chest wall: No tenderness.  Musculoskeletal:        General: No swelling. Normal range of motion.     Cervical back: Normal range of motion and neck supple.  Skin:    General: Skin is warm and dry.  Neurological:     General: No focal deficit present.     Mental Status: He is alert and oriented to person, place, and time.  Psychiatric:        Mood and Affect: Mood normal.        Behavior: Behavior normal.        Thought Content: Thought content normal.        Judgment: Judgment normal.     Adult ECG Report  Rate: 65 ;  Rhythm: atrial fibrillation and Controlled rate.  Normal axis, intervals and durations.;   Narrative Interpretation: A. fib has now replaced sinus bradycardia  Recent Labs:    01/16/2021: TC 145, TG 61, HDL 66, LDL 66.  A1c 7.6. Cr 0.96, K+ 4.7. No results found for: CHOL, HDL, LDLCALC, LDLDIRECT, TRIG, CHOLHDL  No results found for: TSH  ==================================================  COVID-19 Education: The signs and symptoms of COVID-19 were discussed with the patient and how to seek care for testing (follow up with PCP or arrange E-visit).    I spent a total of 16 minutes with the patient spent in direct patient consultation.  Additional time spent with chart review  / charting (studies, outside notes, etc): 10 min Total Time: 26 min   Current medicines are reviewed at length with the patient today.  (+/- concerns) none  This visit occurred during the SARS-CoV-2 public health emergency.  Safety protocols were in place, including screening questions prior to the visit, additional usage of staff PPE, and extensive cleaning of exam room while observing appropriate contact time as indicated for disinfecting solutions.  Notice: This dictation was prepared with Dragon dictation along with smaller phrase  technology. Any transcriptional errors that result from this process are unintentional and may not be  corrected upon review.  Patient Instructions / Medication Changes & Studies & Tests Ordered   Patient Instructions  Medication Instructions:   No changes  *If you need a refill on your cardiac medications before your next appointment, please call your pharmacy*   Lab Work: Not needed    Testing/Procedures:  Not needed  Follow-Up: At [facility], you and your health needs are our priority.  As part of our continuing mission to provide you with exceptional heart care, we have created designated Provider Care Teams.  These Care Teams include your primary Cardiologist (physician) and Advanced Practice Providers (APPs -  Physician Assistants and Nurse Practitioners) who all work together to provide you with the care you need, when you need it.     Your next appointment:   12 month(s)  The format for your next appointment:   In Person  Provider:   Glenetta Hew, MD    Studies Ordered:   Orders Placed This Encounter  Procedures  . EKG 12-Lead     Glenetta Hew, M.D., M.S. Interventional Cardiologist   Pager # [phone] Phone # [phone] [address] [place], [place] [address]   Thank you for choosing Heartcare at [facility]!!

## 2021-02-21 NOTE — Patient Instructions (Signed)

## 2021-03-15 ENCOUNTER — Encounter: Payer: Self-pay | Admitting: Cardiology

## 2021-03-15 NOTE — Assessment & Plan Note (Addendum)
Excellent control as of April.  Being monitored by PCP.  He is on low-dose atorvastatin and doing well.  A1c was 7.6 on last check on combination metformin and glimepiride.  He has been increased to 1000 mg twice daily metformin.  Being managed by PCP.

## 2021-03-15 NOTE — Assessment & Plan Note (Signed)
He is currently in A. fib, and remains totally asymptomatic.  His rate is well controlled without AV nodal agents.  If any, would monitor for bradycardia.  Doing well with apixaban.  No bleeding issues.

## 2021-03-15 NOTE — Assessment & Plan Note (Signed)
With hypertension, diabetes and hyperlipidemia he is somewhat borderline for metabolic syndrome as his HDL is in the 60s and triglycerides have been controlled.    regardless, he is still at risk for CAD.  He had a negative Myoview and evaluation for etiology for A. Fib.  No active symptoms. Recommend that he continue to stay active.  Continue risk factor modification.

## 2021-03-15 NOTE — Assessment & Plan Note (Signed)
His blood pressure is borderline high today.  Was recently started on amlodipine up to 5 mg along with his lisinopril 40 mg.  He can probably tolerate increasing amlodipine all the way up to 10 mg, but this was just a recent change.  Will defer to PCP.

## 2021-03-18 DIAGNOSIS — E782 Mixed hyperlipidemia: Secondary | ICD-10-CM | POA: Diagnosis not present

## 2021-03-18 DIAGNOSIS — I10 Essential (primary) hypertension: Secondary | ICD-10-CM | POA: Diagnosis not present

## 2021-03-18 DIAGNOSIS — E1169 Type 2 diabetes mellitus with other specified complication: Secondary | ICD-10-CM | POA: Diagnosis not present

## 2021-03-20 DIAGNOSIS — H5203 Hypermetropia, bilateral: Secondary | ICD-10-CM | POA: Diagnosis not present

## 2021-03-20 DIAGNOSIS — E119 Type 2 diabetes mellitus without complications: Secondary | ICD-10-CM | POA: Diagnosis not present

## 2021-03-20 DIAGNOSIS — H2513 Age-related nuclear cataract, bilateral: Secondary | ICD-10-CM | POA: Diagnosis not present

## 2021-03-20 DIAGNOSIS — H524 Presbyopia: Secondary | ICD-10-CM | POA: Diagnosis not present

## 2021-04-11 DIAGNOSIS — R197 Diarrhea, unspecified: Secondary | ICD-10-CM | POA: Diagnosis not present

## 2021-05-02 DIAGNOSIS — C44712 Basal cell carcinoma of skin of right lower limb, including hip: Secondary | ICD-10-CM | POA: Diagnosis not present

## 2021-05-02 DIAGNOSIS — D485 Neoplasm of uncertain behavior of skin: Secondary | ICD-10-CM | POA: Diagnosis not present

## 2021-05-02 DIAGNOSIS — L570 Actinic keratosis: Secondary | ICD-10-CM | POA: Diagnosis not present

## 2021-06-05 DIAGNOSIS — Z1211 Encounter for screening for malignant neoplasm of colon: Secondary | ICD-10-CM | POA: Diagnosis not present

## 2021-06-05 DIAGNOSIS — R197 Diarrhea, unspecified: Secondary | ICD-10-CM | POA: Diagnosis not present

## 2021-06-05 DIAGNOSIS — Z8679 Personal history of other diseases of the circulatory system: Secondary | ICD-10-CM | POA: Diagnosis not present

## 2021-06-06 ENCOUNTER — Telehealth: Payer: Self-pay | Admitting: *Deleted

## 2021-06-06 NOTE — Telephone Encounter (Signed)
   [facility] Pre-operative Risk Assessment    Patient Name: Daniel Taylor  DOB: 07/07/1946 MRN: [id number]    Request for surgical clearance:  What type of surgery is being performed? Colonoscopy ( colon screening)   When is this surgery scheduled? 08/28/2021  What type of clearance is required (medical clearance vs. Pharmacy clearance to hold med vs. Both)? both  Are there any medications that need to be held prior to surgery and how long? Eliquis hold 2 days prior  Practice name and name of physician performing surgery? [facility]; Dr Daniel Taylor  What is the office phone number? [phone]   7.   What is the office fax number? [phone]  8.   Anesthesia type (None, local, MAC, general) ? propofol   Daniel Taylor 06/06/2021, 12:18 PM  _________________________________________________________________   (provider comments below)

## 2021-06-10 DIAGNOSIS — E1169 Type 2 diabetes mellitus with other specified complication: Secondary | ICD-10-CM | POA: Diagnosis not present

## 2021-06-10 DIAGNOSIS — I10 Essential (primary) hypertension: Secondary | ICD-10-CM | POA: Diagnosis not present

## 2021-06-10 DIAGNOSIS — E782 Mixed hyperlipidemia: Secondary | ICD-10-CM | POA: Diagnosis not present

## 2021-06-12 DIAGNOSIS — R197 Diarrhea, unspecified: Secondary | ICD-10-CM | POA: Diagnosis not present

## 2021-06-12 NOTE — Telephone Encounter (Signed)
Left VM

## 2021-06-12 NOTE — Telephone Encounter (Signed)
Patient with diagnosis of afib on Eliquis for anticoagulation.    Procedure: colonoscopy Date of procedure: 08/28/21  CHA2DS2-VASc Score = 3  This indicates a 3.2% annual risk of stroke. The patient's score is based upon: CHF History: No HTN History: Yes Diabetes History: Yes Stroke History: No Vascular Disease History: No Age Score: 1 Gender Score: 0   CrCl 39m/min  Per office protocol, patient can hold Eliquis for 2 days prior to procedure as requested.

## 2021-07-02 NOTE — Telephone Encounter (Signed)
   Name: Daniel Taylor  DOB: [birth year]  MRN: [id number]   Primary Cardiologist: Glenetta Hew, MD  Chart reviewed as part of pre-operative protocol coverage. Patient was contacted 07/02/2021 in reference to pre-operative risk assessment for pending surgery as outlined below.  Daniel Taylor was last seen on 02/2021 by Dr. Ellyn Hack. I reached out to patient for update on how he is doing. The patient affirms he has been doing well without any new cardiac symptoms. Therefore, based on ACC/AHA guidelines, the patient would be at acceptable risk for the planned procedure without further cardiovascular testing. The patient was advised that if he develops new symptoms prior to surgery to contact our office to arrange for a follow-up visit, and he verbalized understanding.   Per pharmD team, Per office protocol, patient can hold Eliquis for 2 days prior to procedure as requested.    I will route this recommendation to the requesting party via Epic fax function and remove from pre-op pool. Please call with questions.  Charlie Pitter, PA-C 07/02/2021, 8:38 AM

## 2021-07-31 DIAGNOSIS — I10 Essential (primary) hypertension: Secondary | ICD-10-CM | POA: Diagnosis not present

## 2021-07-31 DIAGNOSIS — E1169 Type 2 diabetes mellitus with other specified complication: Secondary | ICD-10-CM | POA: Diagnosis not present

## 2021-08-28 DIAGNOSIS — Z1211 Encounter for screening for malignant neoplasm of colon: Secondary | ICD-10-CM | POA: Diagnosis not present

## 2021-08-28 DIAGNOSIS — K635 Polyp of colon: Secondary | ICD-10-CM | POA: Diagnosis not present

## 2021-08-28 DIAGNOSIS — Q438 Other specified congenital malformations of intestine: Secondary | ICD-10-CM | POA: Diagnosis not present

## 2021-09-03 DIAGNOSIS — K635 Polyp of colon: Secondary | ICD-10-CM | POA: Diagnosis not present

## 2021-09-10 DIAGNOSIS — E782 Mixed hyperlipidemia: Secondary | ICD-10-CM | POA: Diagnosis not present

## 2021-09-10 DIAGNOSIS — E1169 Type 2 diabetes mellitus with other specified complication: Secondary | ICD-10-CM | POA: Diagnosis not present

## 2021-09-10 DIAGNOSIS — I10 Essential (primary) hypertension: Secondary | ICD-10-CM | POA: Diagnosis not present

## 2021-09-11 DIAGNOSIS — L570 Actinic keratosis: Secondary | ICD-10-CM | POA: Diagnosis not present

## 2021-09-11 DIAGNOSIS — Z85828 Personal history of other malignant neoplasm of skin: Secondary | ICD-10-CM | POA: Diagnosis not present

## 2021-09-30 DIAGNOSIS — J019 Acute sinusitis, unspecified: Secondary | ICD-10-CM | POA: Diagnosis not present

## 2021-12-02 DIAGNOSIS — L218 Other seborrheic dermatitis: Secondary | ICD-10-CM | POA: Diagnosis not present

## 2021-12-02 DIAGNOSIS — Z85828 Personal history of other malignant neoplasm of skin: Secondary | ICD-10-CM | POA: Diagnosis not present

## 2021-12-03 DIAGNOSIS — R1084 Generalized abdominal pain: Secondary | ICD-10-CM | POA: Diagnosis not present

## 2021-12-03 DIAGNOSIS — R197 Diarrhea, unspecified: Secondary | ICD-10-CM | POA: Diagnosis not present

## 2021-12-03 DIAGNOSIS — R140 Abdominal distension (gaseous): Secondary | ICD-10-CM | POA: Diagnosis not present

## 2022-01-14 DIAGNOSIS — I10 Essential (primary) hypertension: Secondary | ICD-10-CM | POA: Diagnosis not present

## 2022-01-14 DIAGNOSIS — F959 Tic disorder, unspecified: Secondary | ICD-10-CM | POA: Diagnosis not present

## 2022-01-14 DIAGNOSIS — I480 Paroxysmal atrial fibrillation: Secondary | ICD-10-CM | POA: Diagnosis not present

## 2022-01-22 DIAGNOSIS — Z125 Encounter for screening for malignant neoplasm of prostate: Secondary | ICD-10-CM | POA: Diagnosis not present

## 2022-01-22 DIAGNOSIS — E782 Mixed hyperlipidemia: Secondary | ICD-10-CM | POA: Diagnosis not present

## 2022-01-22 DIAGNOSIS — I10 Essential (primary) hypertension: Secondary | ICD-10-CM | POA: Diagnosis not present

## 2022-01-22 DIAGNOSIS — E1169 Type 2 diabetes mellitus with other specified complication: Secondary | ICD-10-CM | POA: Diagnosis not present

## 2022-01-29 DIAGNOSIS — D6869 Other thrombophilia: Secondary | ICD-10-CM | POA: Diagnosis not present

## 2022-01-29 DIAGNOSIS — E782 Mixed hyperlipidemia: Secondary | ICD-10-CM | POA: Diagnosis not present

## 2022-01-29 DIAGNOSIS — Z23 Encounter for immunization: Secondary | ICD-10-CM | POA: Diagnosis not present

## 2022-01-29 DIAGNOSIS — I10 Essential (primary) hypertension: Secondary | ICD-10-CM | POA: Diagnosis not present

## 2022-01-29 DIAGNOSIS — I4892 Unspecified atrial flutter: Secondary | ICD-10-CM | POA: Diagnosis not present

## 2022-01-29 DIAGNOSIS — Z0000 Encounter for general adult medical examination without abnormal findings: Secondary | ICD-10-CM | POA: Diagnosis not present

## 2022-01-29 DIAGNOSIS — Z1159 Encounter for screening for other viral diseases: Secondary | ICD-10-CM | POA: Diagnosis not present

## 2022-01-29 DIAGNOSIS — E1169 Type 2 diabetes mellitus with other specified complication: Secondary | ICD-10-CM | POA: Diagnosis not present

## 2022-01-29 DIAGNOSIS — Z125 Encounter for screening for malignant neoplasm of prostate: Secondary | ICD-10-CM | POA: Diagnosis not present

## 2022-02-19 DIAGNOSIS — R197 Diarrhea, unspecified: Secondary | ICD-10-CM | POA: Diagnosis not present

## 2022-02-19 DIAGNOSIS — A049 Bacterial intestinal infection, unspecified: Secondary | ICD-10-CM | POA: Diagnosis not present

## 2022-02-19 DIAGNOSIS — R109 Unspecified abdominal pain: Secondary | ICD-10-CM | POA: Diagnosis not present

## 2022-02-19 DIAGNOSIS — R140 Abdominal distension (gaseous): Secondary | ICD-10-CM | POA: Diagnosis not present

## 2022-04-01 NOTE — Progress Notes (Unsigned)
Office Visit    Patient Name: Daniel Taylor Date of Encounter: 04/02/2022  PCP:  Lawerance Cruel, [facility]  Cardiologist:  Glenetta Hew, MD  Advanced Practice Provider:  No care team member to display Electrophysiologist:  None   HPI    Daniel Taylor is a 76 y.o. male with a hx of paroxysmal atrial fibrillation (diagnosed fall 2019), HTN, HLD and DM 2 presents today for annual follow-up appointment.  The patient was seen February 01, 2020 for follow-up and had not had any episodes of atrial fibrillation at that time.  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  No bleeding issues on Eliquis for anticoagulation.    When he was last seen May 2022, he had no signs of fatigue or exertional dyspnea.  Otherwise asymptomatic from a cardiac standpoint.  He had some trivial edema from amlodipine but otherwise been doing well.  Today, he feels well without any chest pain or shortness of breath.  He has been having some GI issues but has been stable from a cardiac standpoint.  He has a history of paroxysmal atrial fibrillation and is in rate controlled A-fib today.  We reviewed his EKG and his rate is usually slow in the 50s.  He is asymptomatic.  He has been maintaining a moderate activity level and still works at a [employer].  We discussed dietary changes including fresh fruits and vegetables and lean meats.  He does not eat any fried food and tries to maintain healthy diet.  His most recent LDL was 64, HDL 65, total cholesterol 145, and triglycerides 101.  He remains on Lipitor 10 mg daily.  His blood pressure is well controlled today and he continues on lisinopril 40 mg daily and Norvasc 5 mg daily.  He takes his blood pressure at home and it is usually in the 188C systolic.  Reports no shortness of breath nor dyspnea on exertion. Reports no chest pain, pressure, or tightness. No edema, orthopnea, PND. Reports no palpitations.    Past Medical  History    Past Medical History:  Diagnosis Date   Anxiety    Diabetes mellitus    Hyperlipidemia    Hypertension    pcp  dr Harrington Challenger   @ [facility]   Paroxysmal atrial fibrillation ([place]) 03/2018   Rate controlled.  Asymptomatic; anticoagulated with Eliquis.   Right rotator cuff tear    Past Surgical History:  Procedure Laterality Date   HERNIA REPAIR  INFANT   INGUINAL HERNIA REPAIR  09/10/2011   Procedure: HERNIA REPAIR INGUINAL ADULT;  Surgeon: Imogene Burn. Georgette Dover, MD;  Location: [place];  Service: General;  Laterality: Left;  REPAIR LEFT HERNIA INGUINAL ADULT WITH MESH   NM MYOVIEW LTD  04/2018    No ischemia/Infarction.  (Not gated b/c Afib.)   SHOULDER OPEN ROTATOR CUFF REPAIR Right 02/16/2013   Procedure: OPEN RIGHT SHOULDER ROTATOR CUFF REPAIR WITH GRAFT AND ANCHORS;  Surgeon: Tobi Bastos, MD;  Location: [place];  Service: Orthopedics;  Laterality: Right;   TONSILLECTOMY AND ADENOIDECTOMY  76 years old   TRANSTHORACIC ECHOCARDIOGRAM  04/2018   EF 55%. Gr1 DD. Mod LVH - normal    Allergies  No Known Allergies  EKGs/Labs/Other Studies Reviewed:   The following studies were reviewed today:  LEXISCAN 04/21/2018  Stress FindingsThe patient exercised following the Bruce protocol.   The patient reported fatigue during the stress test. The patient experienced  no angina during the stress test.   The test was stopped because  the patient complained of fatigue.   Blood pressure and heart rate demonstrated a normal response to exercise. Blood pressure demonstrated a normal response to exercise. Overall, the patient's exercise capacity was normal.   85% of maximum heart rate was achieved after 5 minutes.  Recovery time:  5 minutes.  The patient's response to exercise was adequate for diagnosis. Pt was back in afib today. Ekg taken to Dr Martinique who said ok to proceed with exercise. Dr Martinique reviewed [place] study and EKG's. Ok to d/c pt home and pt advised to keep ROV  w/ Dr Ellyn Hack in September.   EKG:  EKG is  ordered today.  The ekg ordered today demonstrates rate-controlled afib, rate 53 bpm  Recent Labs: No results found for requested labs within last 365 days.  Recent Lipid Panel No results found for: "CHOL", "TRIG", "HDL", "CHOLHDL", "VLDL", "LDLCALC", "LDLDIRECT"  Risk Assessment/Calculations:   CHA2DS2-VASc Score = 3   This indicates a 3.2% annual risk of stroke. The patient's score is based upon: CHF History: 0 HTN History: 1 Diabetes History: 1 Stroke History: 0 Vascular Disease History: 0 Age Score: 1 Gender Score: 0     Home Medications   Current Meds  Medication Sig   amLODipine (NORVASC) 5 MG tablet Take 5 mg by mouth daily.   apixaban (ELIQUIS) 5 MG TABS tablet Take 1 tablet (5 mg total) by mouth 2 (two) times daily.   atorvastatin (LIPITOR) 10 MG tablet Take 10 mg by mouth daily.     glimepiride (AMARYL) 1 MG tablet Take 1 mg by mouth daily with breakfast.   lisinopril (PRINIVIL,ZESTRIL) 40 MG tablet Take 40 mg by mouth daily.     metFORMIN (GLUCOPHAGE) 500 MG tablet Take 1,000 mg by mouth 2 (two) times daily with a meal.    NON FORMULARY OneTouch Delica Lancets 33 gauge   NON FORMULARY OneTouch Verio test strips   NON FORMULARY OneTouch Verio Meter     Review of Systems      All other systems reviewed and are otherwise negative except as noted above.  Physical Exam    VS:  BP 134/80 (BP Location: Left Arm, Patient Position: Sitting, Cuff Size: Normal)   Pulse (!) 53   Ht '5\' 5"'$  (1.651 m)   Wt 150 lb (68 kg)   BMI 24.96 kg/m  , BMI Body mass index is 24.96 kg/m.  Wt Readings from Last 3 Encounters:  04/02/22 150 lb (68 kg)  02/21/21 151 lb 3.2 oz (68.6 kg)  02/01/20 151 lb 9.6 oz (68.8 kg)     GEN: Well nourished, well developed, in no acute distress. HEENT: normal. Neck: Supple, no JVD, carotid bruits, or masses. Cardiac: irregularly irregular, no murmurs, rubs, or gallops. No clubbing, cyanosis, edema.   Radials/PT 2+ and equal bilaterally.  Respiratory:  Respirations regular and unlabored, clear to auscultation bilaterally. GI: Soft, nontender, nondistended. MS: No deformity or atrophy. Skin: Warm and dry, no rash. Neuro:  Strength and sensation are intact. Psych: Normal affect.  Assessment & Plan    Paroxysmal atrial fibrillation -Has been in rate controlled Afib for years -Monitor for bradycardia, asymptomatic -Remains on Eliquis 5 mg twice a day without any bleeding  Hyperlipidemia due to type 2 diabetes mellitus -Continue low-dose atorvastatin -Lipid panel being monitored by PCP -Most recent lipid panel showed HDL 65, LDL 62, total cholesterol 145, triglycerides 101 -Lipid lowering diet reviewed  Essential hypertension -Continue amlodipine 5 mg and lisinopril 40 mg daily -Well-controlled at home -134/80 here in the clinic  Chronic metabolic syndrome -High risk for CAD.  Negative Myoview's -No chest pain or shortness of breath reported    Disposition: Follow up  1 year  with Glenetta Hew, MD or APP.  Signed, Elgie Collard, PA-C 04/02/2022, 10:27 AM [place]

## 2022-04-02 ENCOUNTER — Ambulatory Visit (INDEPENDENT_AMBULATORY_CARE_PROVIDER_SITE_OTHER): Payer: PPO | Admitting: Physician Assistant

## 2022-04-02 ENCOUNTER — Encounter: Payer: Self-pay | Admitting: Nurse Practitioner

## 2022-04-02 VITALS — BP 134/80 | HR 53 | Ht 65.0 in | Wt 150.0 lb

## 2022-04-02 DIAGNOSIS — E1169 Type 2 diabetes mellitus with other specified complication: Secondary | ICD-10-CM | POA: Diagnosis not present

## 2022-04-02 DIAGNOSIS — I10 Essential (primary) hypertension: Secondary | ICD-10-CM

## 2022-04-02 DIAGNOSIS — E785 Hyperlipidemia, unspecified: Secondary | ICD-10-CM

## 2022-04-02 DIAGNOSIS — I480 Paroxysmal atrial fibrillation: Secondary | ICD-10-CM

## 2022-04-02 DIAGNOSIS — E8881 Metabolic syndrome: Secondary | ICD-10-CM

## 2022-04-02 NOTE — Patient Instructions (Signed)
Medication Instructions:  ?Your physician recommends that you continue on your current medications as directed. Please refer to the Current Medication list given to you today.  ? ?*If you need a refill on your cardiac medications before your next appointment, please call your pharmacy* ? ? ?Lab Work: ?NONE ordered at this time of appointment  ? ?If you have labs (blood work) drawn today and your tests are completely normal, you will receive your results only by: ?MyChart Message (if you have MyChart) OR ?A paper copy in the mail ?If you have any lab test that is abnormal or we need to change your treatment, we will call you to review the results. ? ? ?Testing/Procedures: ?NONE ordered at this time of appointment  ? ? ? ?Follow-Up: ?At [facility], you and your health needs are our priority.  As part of our continuing mission to provide you with exceptional heart care, we have created designated Provider Care Teams.  These Care Teams include your primary Cardiologist (physician) and Advanced Practice Providers (APPs -  Physician Assistants and Nurse Practitioners) who all work together to provide you with the care you need, when you need it. ? ?We recommend signing up for the patient portal called "MyChart".  Sign up information is provided on this After Visit Summary.  MyChart is used to connect with patients for Virtual Visits (Telemedicine).  Patients are able to view lab/test results, encounter notes, upcoming appointments, etc.  Non-urgent messages can be sent to your provider as well.   ?To learn more about what you can do with MyChart, go to [email or web address].   ? ?Your next appointment:   ?1 year(s) ? ?The format for your next appointment:   ?In Person ? ?Provider:   ?David Harding, MD   ? ? ?Other Instructions ? ? ?Important Information About Sugar ? ? ? ? ? ? ?

## 2022-04-04 DIAGNOSIS — K047 Periapical abscess without sinus: Secondary | ICD-10-CM | POA: Diagnosis not present

## 2022-04-21 DIAGNOSIS — M545 Low back pain, unspecified: Secondary | ICD-10-CM | POA: Diagnosis not present

## 2022-05-08 DIAGNOSIS — R609 Edema, unspecified: Secondary | ICD-10-CM | POA: Diagnosis not present

## 2022-05-08 DIAGNOSIS — S8011XA Contusion of right lower leg, initial encounter: Secondary | ICD-10-CM | POA: Diagnosis not present

## 2022-05-28 DIAGNOSIS — E119 Type 2 diabetes mellitus without complications: Secondary | ICD-10-CM | POA: Diagnosis not present

## 2022-05-28 DIAGNOSIS — H02831 Dermatochalasis of right upper eyelid: Secondary | ICD-10-CM | POA: Diagnosis not present

## 2022-05-28 DIAGNOSIS — H5201 Hypermetropia, right eye: Secondary | ICD-10-CM | POA: Diagnosis not present

## 2022-05-28 DIAGNOSIS — H2513 Age-related nuclear cataract, bilateral: Secondary | ICD-10-CM | POA: Diagnosis not present

## 2022-06-02 ENCOUNTER — Other Ambulatory Visit: Payer: Self-pay | Admitting: Family Medicine

## 2022-06-11 DIAGNOSIS — R195 Other fecal abnormalities: Secondary | ICD-10-CM | POA: Diagnosis not present

## 2022-06-25 DIAGNOSIS — R253 Fasciculation: Secondary | ICD-10-CM | POA: Diagnosis not present

## 2022-08-06 DIAGNOSIS — E1169 Type 2 diabetes mellitus with other specified complication: Secondary | ICD-10-CM | POA: Diagnosis not present

## 2022-08-06 DIAGNOSIS — Z6825 Body mass index (BMI) 25.0-25.9, adult: Secondary | ICD-10-CM | POA: Diagnosis not present

## 2022-08-06 DIAGNOSIS — I10 Essential (primary) hypertension: Secondary | ICD-10-CM | POA: Diagnosis not present

## 2022-09-10 DIAGNOSIS — R195 Other fecal abnormalities: Secondary | ICD-10-CM | POA: Diagnosis not present

## 2022-09-10 DIAGNOSIS — K638219 Small intestinal bacterial overgrowth, unspecified: Secondary | ICD-10-CM | POA: Diagnosis not present

## 2022-09-25 DIAGNOSIS — L820 Inflamed seborrheic keratosis: Secondary | ICD-10-CM | POA: Diagnosis not present

## 2022-09-25 DIAGNOSIS — L821 Other seborrheic keratosis: Secondary | ICD-10-CM | POA: Diagnosis not present

## 2022-09-25 DIAGNOSIS — Z85828 Personal history of other malignant neoplasm of skin: Secondary | ICD-10-CM | POA: Diagnosis not present

## 2022-10-14 DIAGNOSIS — Z6826 Body mass index (BMI) 26.0-26.9, adult: Secondary | ICD-10-CM | POA: Diagnosis not present

## 2022-10-14 DIAGNOSIS — G245 Blepharospasm: Secondary | ICD-10-CM | POA: Diagnosis not present

## 2022-11-12 DIAGNOSIS — K638219 Small intestinal bacterial overgrowth, unspecified: Secondary | ICD-10-CM | POA: Diagnosis not present

## 2022-11-12 DIAGNOSIS — R109 Unspecified abdominal pain: Secondary | ICD-10-CM | POA: Diagnosis not present

## 2022-11-12 DIAGNOSIS — R195 Other fecal abnormalities: Secondary | ICD-10-CM | POA: Diagnosis not present

## 2022-11-19 DIAGNOSIS — R195 Other fecal abnormalities: Secondary | ICD-10-CM | POA: Diagnosis not present

## 2022-12-04 ENCOUNTER — Ambulatory Visit: Payer: PPO | Admitting: Neurology

## 2022-12-04 VITALS — BP 162/88 | HR 94 | Ht 65.0 in | Wt 150.0 lb

## 2022-12-04 DIAGNOSIS — G5132 Clonic hemifacial spasm, left: Secondary | ICD-10-CM | POA: Diagnosis not present

## 2022-12-04 NOTE — Progress Notes (Signed)
Chief Complaint  Patient presents with   New Patient (Initial Visit)    Rm 14, alone Paper referral for Eye twitching, left eye only      ASSESSMENT AND PLAN  Daniel Taylor is a 77 y.o. male   Left hemifacial spasm  Prior authorization for EMG guided low-dose xeomin injection,  Return to clinic in 3 to 4 weeks,  Examination is otherwise normal, discussed with patient, decided to hold off MRI of brain   DIAGNOSTIC DATA (LABS, IMAGING, TESTING) - I reviewed patient records, labs, notes, testing and imaging myself where available.   MEDICAL HISTORY:  Daniel Taylor, is a 77 year old male seen in request by his primary care physician from [facility] Dr. Harrington Challenger, Dwyane Luo for evaluation of left hemifacial spasm, initial evaluation December 04, 2022    I reviewed and summarized the referring note. PMHX. HTN A fib HLD DM  He still works full-time, is a Air cabin crew, noticed left eye twitching around 2023, mainly involving left lower eyelid, gradually become more frequent, spreading to left cheek area, more of a nuisance for him, sometimes difficulty focusing while reading,  He denies visual loss, denies  hearing loss, no swallowing difficulty.    PHYSICAL EXAM:   Vitals:   12/04/22 1603  BP: (!) 162/88  Pulse: 94  Weight: 150 lb (68 kg)  Height: 5' 5"$  (1.651 m)   Not recorded     Body mass index is 24.96 kg/m.  PHYSICAL EXAMNIATION:  Gen: NAD, conversant, well nourised, well groomed                     Cardiovascular: Regular rate rhythm, no peripheral edema, warm, nontender. Eyes: Conjunctivae clear without exudates or hemorrhage Neck: Supple, no carotid bruits. Pulmonary: Clear to auscultation bilaterally   NEUROLOGICAL EXAM:  MENTAL STATUS: Speech/cognition: Awake, alert, oriented to history taking and casual conversation CRANIAL NERVES: CN II: Visual fields are full to confrontation. Pupils are round equal and briskly reactive to  light. CN III, IV, VI: extraocular movement are normal. No ptosis.  CN V: Facial sensation is intact to light touch.  Bilateral corneal reflex was symmetric. CN VII: Face is symmetric with normal eye closure occasionally left orbicularis oculi muscle twitching, involving left cheek muscle some, CN VIII: Hearing is normal to causal conversation. CN IX, X: Phonation is normal. CN XI: Head turning and shoulder shrug are intact  MOTOR: There is no pronator drift of out-stretched arms. Muscle bulk and tone are normal. Muscle strength is normal.  REFLEXES: Reflexes are 2+ and symmetric at the biceps, triceps, knees, and ankles. Plantar responses are flexor.  SENSORY: Intact to light touch, pinprick and vibratory sensation are intact in fingers and toes.  COORDINATION: There is no trunk or limb dysmetria noted.  GAIT/STANCE: Posture is normal. Gait is steady with normal steps, base, arm swing, and turning. Heel and toe walking are normal. Tandem gait is normal.  Romberg is absent.  REVIEW OF SYSTEMS:  Full 14 system review of systems performed and notable only for as above All other review of systems were negative.   ALLERGIES: No Known Allergies  HOME MEDICATIONS: Current Outpatient Medications  Medication Sig Dispense Refill   amLODipine (NORVASC) 5 MG tablet Take 5 mg by mouth daily.     apixaban (ELIQUIS) 5 MG TABS tablet Take 1 tablet (5 mg total) by mouth 2 (two) times daily. 180 tablet 2   atorvastatin (LIPITOR) 10 MG tablet Take  10 mg by mouth daily.       glimepiride (AMARYL) 1 MG tablet Take 1 mg by mouth daily with breakfast.     lisinopril (PRINIVIL,ZESTRIL) 40 MG tablet Take 40 mg by mouth daily.       metFORMIN (GLUCOPHAGE) 500 MG tablet Take 1,000 mg by mouth 2 (two) times daily with a meal.      NON FORMULARY OneTouch Delica Lancets 33 gauge     NON FORMULARY OneTouch Verio test strips     NON FORMULARY OneTouch Verio Meter     No current facility-administered  medications for this visit.    PAST MEDICAL HISTORY: Past Medical History:  Diagnosis Date   Anxiety    Diabetes mellitus    Hyperlipidemia    Hypertension    pcp  dr Harrington Challenger   @ [facility]   Paroxysmal atrial fibrillation ([place]) 03/2018   Rate controlled.  Asymptomatic; anticoagulated with Eliquis.   Right rotator cuff tear     PAST SURGICAL HISTORY: Past Surgical History:  Procedure Laterality Date   HERNIA REPAIR  INFANT   INGUINAL HERNIA REPAIR  09/10/2011   Procedure: HERNIA REPAIR INGUINAL ADULT;  Surgeon: Imogene Burn. Georgette Dover, MD;  Location: [place];  Service: General;  Laterality: Left;  REPAIR LEFT HERNIA INGUINAL ADULT WITH MESH   NM MYOVIEW LTD  04/2018    No ischemia/Infarction.  (Not gated b/c Afib.)   SHOULDER OPEN ROTATOR CUFF REPAIR Right 02/16/2013   Procedure: OPEN RIGHT SHOULDER ROTATOR CUFF REPAIR WITH GRAFT AND ANCHORS;  Surgeon: Tobi Bastos, MD;  Location: [place];  Service: Orthopedics;  Laterality: Right;   TONSILLECTOMY AND ADENOIDECTOMY  77 years old   TRANSTHORACIC ECHOCARDIOGRAM  04/2018   EF 55%. Gr1 DD. Mod LVH - normal    FAMILY HISTORY: Family History  Problem Relation Age of Onset   Depression Mother        suicide   Lung cancer Father    Healthy Brother     SOCIAL HISTORY: Social History   Socioeconomic History   Marital status: Married    Spouse name: Not on file   Number of children: Not on file   Years of education: Not on file   Highest education level: Not on file  Occupational History   Not on file  Tobacco Use   Smoking status: Former    Packs/day: 1.00    Years: 4.00    Total pack years: 4.00    Types: Cigarettes    Start date: 71    Quit date: 1979    Years since quitting: 45.1   Smokeless tobacco: Never  Substance and Sexual Activity   Alcohol use: Yes    Comment: socially - 1-7 / month   Drug use: No   Sexual activity: Not on file  Other Topics Concern   Not on file  Social History Narrative    Married - 2 children (adopted) with 4 grandchildren.   Works in [place]    2 cups coffee / day, 1 of tea.   Trying to get back to exercise - always up & about, on feet.  May do exercise bike ~30 min or more.   Social Determinants of Health   Financial Resource Strain: Not on file  Food Insecurity: Not on file  Transportation Needs: Not on file  Physical Activity: Not on file  Stress: Not on file  Social Connections: Not on file  Intimate Partner Violence: Not on file  Marcial Pacas, M.D. Ph.D.  [facility] [address] [place], [place] [address] Ph: [phone] Fax: [phone]  CC:  Lawerance Cruel, MD [place],  [place] [address]  Lawerance Cruel, MD

## 2022-12-24 ENCOUNTER — Telehealth: Payer: Self-pay

## 2022-12-24 NOTE — Telephone Encounter (Signed)
Prior authorization for EMG guided low-dose xeomin injection,  50 units  ICD-G51.3

## 2023-01-01 ENCOUNTER — Other Ambulatory Visit (HOSPITAL_COMMUNITY): Payer: Self-pay

## 2023-01-09 ENCOUNTER — Other Ambulatory Visit (HOSPITAL_COMMUNITY): Payer: Self-pay

## 2023-01-09 NOTE — Telephone Encounter (Signed)
Submitted a benefits verification request via Xeomin portal.

## 2023-01-12 NOTE — Telephone Encounter (Signed)
Pt called. Stated he is following up on approval of Xeomin. Pt is requesting a call back from nurse

## 2023-01-13 NOTE — Telephone Encounter (Signed)
     Per Benefits verification this does not require a PA for medical benefits Buy and Bill or specialty pharmacy-the coinsurance and financial information is included.

## 2023-01-13 NOTE — Telephone Encounter (Signed)
Please schedule xeomin with dr Krista Blue next Available.

## 2023-02-04 DIAGNOSIS — E782 Mixed hyperlipidemia: Secondary | ICD-10-CM | POA: Diagnosis not present

## 2023-02-04 DIAGNOSIS — I10 Essential (primary) hypertension: Secondary | ICD-10-CM | POA: Diagnosis not present

## 2023-02-04 DIAGNOSIS — Z1159 Encounter for screening for other viral diseases: Secondary | ICD-10-CM | POA: Diagnosis not present

## 2023-02-04 DIAGNOSIS — Z125 Encounter for screening for malignant neoplasm of prostate: Secondary | ICD-10-CM | POA: Diagnosis not present

## 2023-02-04 DIAGNOSIS — E1169 Type 2 diabetes mellitus with other specified complication: Secondary | ICD-10-CM | POA: Diagnosis not present

## 2023-02-10 NOTE — Telephone Encounter (Signed)
Pt scheduled for first Xeomin injection on 5/22, Dr. Terrace Arabia will be out of the office until 6/20.   Dr. Terrace Arabia, is pt able to be worked in sooner than next available of 6/20?

## 2023-02-10 NOTE — Telephone Encounter (Signed)
It is hemifacial spasm, quick injection, it is ok to add on tomorrow or next Wed,

## 2023-02-11 ENCOUNTER — Telehealth: Payer: Self-pay | Admitting: Neurology

## 2023-02-11 DIAGNOSIS — D6869 Other thrombophilia: Secondary | ICD-10-CM | POA: Diagnosis not present

## 2023-02-11 DIAGNOSIS — E1165 Type 2 diabetes mellitus with hyperglycemia: Secondary | ICD-10-CM | POA: Diagnosis not present

## 2023-02-11 DIAGNOSIS — I4892 Unspecified atrial flutter: Secondary | ICD-10-CM | POA: Diagnosis not present

## 2023-02-11 DIAGNOSIS — Z6825 Body mass index (BMI) 25.0-25.9, adult: Secondary | ICD-10-CM | POA: Diagnosis not present

## 2023-02-11 DIAGNOSIS — Z0000 Encounter for general adult medical examination without abnormal findings: Secondary | ICD-10-CM | POA: Diagnosis not present

## 2023-02-11 DIAGNOSIS — E782 Mixed hyperlipidemia: Secondary | ICD-10-CM | POA: Diagnosis not present

## 2023-02-11 DIAGNOSIS — R351 Nocturia: Secondary | ICD-10-CM | POA: Diagnosis not present

## 2023-02-11 DIAGNOSIS — I10 Essential (primary) hypertension: Secondary | ICD-10-CM | POA: Diagnosis not present

## 2023-02-11 NOTE — Telephone Encounter (Signed)
Error/disregard

## 2023-02-11 NOTE — Telephone Encounter (Signed)
Called pt, he accepted 5/8 at 4:00 pm.

## 2023-02-11 NOTE — Telephone Encounter (Signed)
Cathy from GI states that pt needs recent MRI brain or head CT before scheduling LP.

## 2023-02-18 ENCOUNTER — Ambulatory Visit: Payer: PPO | Admitting: Neurology

## 2023-02-18 VITALS — BP 136/85

## 2023-02-18 DIAGNOSIS — G5132 Clonic hemifacial spasm, left: Secondary | ICD-10-CM

## 2023-02-18 MED ORDER — INCOBOTULINUMTOXINA 50 UNITS IM SOLR
50.0000 [IU] | INTRAMUSCULAR | Status: AC
Start: 2023-02-18 — End: ?
  Administered 2023-02-18: 50 [IU] via INTRAMUSCULAR

## 2023-02-18 NOTE — Progress Notes (Signed)
xenomin 50units x 1 vial  [id number] [phone] Exp-04/2025 B/B  Bacteriostatic 0.9% Sodium Chloride- 1mL  [id number] Expiration: 11/25 NDC: 82956-213-08 Dx: M57.84 WITNESSED ON:GEXBM Yetta Barre RN

## 2023-02-18 NOTE — Progress Notes (Signed)
Chief Complaint  Patient presents with   Procedure    Rm 17 pt has more question for provider prior to injection      ASSESSMENT AND PLAN  Daniel Taylor is a 77 y.o. male   Left hemifacial spasm   1st EMG guided xeomin injection, 50 units/1cc of NS 2.5 units each x15=37.5 units, (discard 12.5 units)    DIAGNOSTIC DATA (LABS, IMAGING, TESTING) - I reviewed patient records, labs, notes, testing and imaging myself where available.   MEDICAL HISTORY:  Daniel Taylor, is a 77 year old male seen in request by his primary care physician from [facility] Dr. Tenny Craw, Darlen Round for evaluation of left hemifacial spasm, initial evaluation December 04, 2022    I reviewed and summarized the referring note. PMHX. HTN A fib HLD DM  He still works full-time, is a [employer] laboratory technician, noticed left eye twitching around 2023, mainly involving left lower eyelid, gradually become more frequent, spreading to left cheek area, more of a nuisance for him, sometimes difficulty focusing while reading,  He denies visual loss, denies  hearing loss, no swallowing difficulty.   UPDATE Feb 18 2023: This is his first EMG guided xeomin injection for left hemifacial spasm, consent signed,  PHYSICAL EXAM:   Vitals:   02/18/23 1544  BP: 136/85    CN VII: Face is symmetric with normal eye closure occasionally left orbicularis oculi muscle twitching, involving left cheek muscle some,  REVIEW OF SYSTEMS:  Full 14 system review of systems performed and notable only for as above All other review of systems were negative.   ALLERGIES: No Known Allergies  HOME MEDICATIONS: Current Outpatient Medications  Medication Sig Dispense Refill   amLODipine (NORVASC) 5 MG tablet Take 5 mg by mouth daily.     apixaban (ELIQUIS) 5 MG TABS tablet Take 1 tablet (5 mg total) by mouth 2 (two) times daily. 180 tablet 2   atorvastatin (LIPITOR) 10 MG tablet Take 10 mg by mouth daily.       glimepiride  (AMARYL) 1 MG tablet Take 1 mg by mouth daily with breakfast.     lisinopril (PRINIVIL,ZESTRIL) 40 MG tablet Take 40 mg by mouth daily.       metFORMIN (GLUCOPHAGE) 500 MG tablet Take 1,000 mg by mouth 2 (two) times daily with a meal.      NON FORMULARY OneTouch Delica Lancets 33 gauge     NON FORMULARY OneTouch Verio test strips     NON FORMULARY OneTouch Verio Meter     No current facility-administered medications for this visit.    PAST MEDICAL HISTORY: Past Medical History:  Diagnosis Date   Anxiety    Diabetes mellitus    Hyperlipidemia    Hypertension    pcp  dr Tenny Craw   @ [facility]   Paroxysmal atrial fibrillation (HCC) 03/2018   Rate controlled.  Asymptomatic; anticoagulated with Eliquis.   Right rotator cuff tear     PAST SURGICAL HISTORY: Past Surgical History:  Procedure Laterality Date   HERNIA REPAIR  INFANT   INGUINAL HERNIA REPAIR  09/10/2011   Procedure: HERNIA REPAIR INGUINAL ADULT;  Surgeon: Wilmon Arms. Corliss Skains, MD;  Location: [facility] OR;  Service: General;  Laterality: Left;  REPAIR LEFT HERNIA INGUINAL ADULT WITH MESH   NM MYOVIEW LTD  04/2018    No ischemia/Infarction.  (Not gated b/c Afib.)   SHOULDER OPEN ROTATOR CUFF REPAIR Right 02/16/2013   Procedure: OPEN RIGHT SHOULDER ROTATOR CUFF REPAIR WITH GRAFT  AND ANCHORS;  Surgeon: Jacki Cones, MD;  Location: [facility]  Service: Orthopedics;  Laterality: Right;   TONSILLECTOMY AND ADENOIDECTOMY  77 years old   TRANSTHORACIC ECHOCARDIOGRAM  04/2018   EF 55%. Gr1 DD. Mod LVH - normal    FAMILY HISTORY: Family History  Problem Relation Age of Onset   Depression Mother        suicide   Lung cancer Father    Healthy Brother     SOCIAL HISTORY: Social History   Socioeconomic History   Marital status: Married    Spouse name: Not on file   Number of children: Not on file   Years of education: Not on file   Highest education level: Not on file  Occupational History   Not on file  Tobacco Use    Smoking status: Former    Packs/day: 1.00    Years: 4.00    Additional pack years: 0.00    Total pack years: 4.00    Types: Cigarettes    Start date: 65    Quit date: 1979    Years since quitting: 45.3   Smokeless tobacco: Never  Substance and Sexual Activity   Alcohol use: Yes    Comment: socially - 1-7 / month   Drug use: No   Sexual activity: Not on file  Other Topics Concern   Not on file  Social History Narrative   Married - 2 children (adopted) with 4 grandchildren.   Works in [employer] business    2 cups coffee / day, 1 of tea.   Trying to get back to exercise - always up & about, on feet.  May do exercise bike ~30 min or more.   Social Determinants of Health   Financial Resource Strain: Not on file  Food Insecurity: Not on file  Transportation Needs: Not on file  Physical Activity: Not on file  Stress: Not on file  Social Connections: Not on file  Intimate Partner Violence: Not on file      Levert Feinstein, M.D. Ph.D.  [facility] [address] [place], [place] [address] Ph: [phone] Fax: [phone]  CC:  Daisy Floro, MD [address] [place],  [place] [address]  Daisy Floro, MD

## 2023-03-02 ENCOUNTER — Telehealth: Payer: Self-pay | Admitting: Neurology

## 2023-03-02 NOTE — Telephone Encounter (Signed)
Let him keep August appt, will see him first, before proceed with planned injection in August.

## 2023-03-02 NOTE — Telephone Encounter (Signed)
Pt stated he would like to update nurse on treatment he got a week ago. Pt is requesting a call back from nurse to discuss.

## 2023-03-04 ENCOUNTER — Ambulatory Visit: Payer: PPO | Admitting: Neurology

## 2023-03-05 NOTE — Telephone Encounter (Signed)
Called patient to see what had changed since last appointment, he states he has some drooping and under eye bags on the left side of his face and the left contact lenses he wears bothers him. HE wants a sooner appointment than the one scheduled in August and I advised that Dr. Terrace Arabia is out for a while and that may not be possible and he said he wants this reviewed and will see someone else if needed. I advised I would send to the work in Doctor to review and decide if a sooner appointment is needed.

## 2023-03-05 NOTE — Telephone Encounter (Signed)
Pt called stating he wants to discuss the reaction he is having to the Botox.  Pt was reminded that he has been advised to keep his upcoming appointment so medications can be discussed at that time. Pt said he can not wait until then, and asked about seeing another Dr.  Rock Nephew was advised he could not be switched to another provider due to his not being available for a call back.  Pt has asked that the message still be sent so someone can call re: the reaction he has to the Botox

## 2023-03-06 DIAGNOSIS — H10412 Chronic giant papillary conjunctivitis, left eye: Secondary | ICD-10-CM | POA: Diagnosis not present

## 2023-03-06 DIAGNOSIS — H04122 Dry eye syndrome of left lacrimal gland: Secondary | ICD-10-CM | POA: Diagnosis not present

## 2023-03-07 NOTE — Telephone Encounter (Signed)
Can you please check with Dr. Lucia Gaskins as I do not do Botox injection.

## 2023-03-12 NOTE — Telephone Encounter (Signed)
Called patient and informed him of being added to the waitlist. No changes or worsening symptoms, just slight numbness at injection sites and the small amount of drooping is still there. He reports feeling fine and no mental status changes or stroke symptoms have been associated with this according to patient, but was agreeable to be on waitlist and states he will keep an eye out for open spots.

## 2023-03-12 NOTE — Telephone Encounter (Signed)
Patient added to [facility]'s waitlist

## 2023-03-12 NOTE — Telephone Encounter (Signed)
The patient needs to be evaluated sooner. You can add him to my cancellation list or schedule with the Nps.

## 2023-03-13 DIAGNOSIS — H04122 Dry eye syndrome of left lacrimal gland: Secondary | ICD-10-CM | POA: Diagnosis not present

## 2023-03-13 DIAGNOSIS — H10412 Chronic giant papillary conjunctivitis, left eye: Secondary | ICD-10-CM | POA: Diagnosis not present

## 2023-03-26 ENCOUNTER — Encounter: Payer: Self-pay | Admitting: Neurology

## 2023-04-23 NOTE — Progress Notes (Signed)
Cardiology Clinic Note   Patient Name: Daniel Taylor Date of Encounter: 04/30/2023  Primary Care Provider:  Daisy Floro, MD Primary Cardiologist:  Bryan Lemma, MD  Patient Profile    Daniel Taylor 77 year old male presents to the clinic today for follow-up evaluation of his hypertension, hyperlipidemia, paroxysmal atrial flutter and metabolic syndrome.  Past Medical History    Past Medical History:  Diagnosis Date   Anxiety    Diabetes mellitus    Hyperlipidemia    Hypertension    pcp  dr Tenny Craw   @ [facility]   Paroxysmal atrial fibrillation (HCC) 03/2018   Rate controlled.  Asymptomatic; anticoagulated with Eliquis.   Right rotator cuff tear    Past Surgical History:  Procedure Laterality Date   HERNIA REPAIR  INFANT   INGUINAL HERNIA REPAIR  09/10/2011   Procedure: HERNIA REPAIR INGUINAL ADULT;  Surgeon: Wilmon Arms. Corliss Skains, MD;  Location: [facility] OR;  Service: General;  Laterality: Left;  REPAIR LEFT HERNIA INGUINAL ADULT WITH MESH   NM MYOVIEW LTD  04/2018    No ischemia/Infarction.  (Not gated b/c Afib.)   SHOULDER OPEN ROTATOR CUFF REPAIR Right 02/16/2013   Procedure: OPEN RIGHT SHOULDER ROTATOR CUFF REPAIR WITH GRAFT AND ANCHORS;  Surgeon: Jacki Cones, MD;  Location: [facility];  Service: Orthopedics;  Laterality: Right;   TONSILLECTOMY AND ADENOIDECTOMY  77 years old   TRANSTHORACIC ECHOCARDIOGRAM  04/2018   EF 55%. Gr1 DD. Mod LVH - normal    Allergies  No Known Allergies  History of Present Illness    Daniel Taylor has a PMH of paroxysmal atrial fibrillation (diagnosed fall 2019), hypertension, hyperlipidemia, type 2 diabetes and metabolic syndrome.  CHA2DS2-VASc score 3.  Echocardiogram 04/12/2018 showed an LVEF of 55% and G1 DD.  He was noted to have mild thickening of his mitral valve.  No other significant valvular abnormalities were noted.  Nuclear stress testing on 04/21/2018 which showed low risk and no ischemia.  He was seen in  follow-up by Dr. Herbie Baltimore on 02/21/2021.  He reported some elevated blood pressures about 1 month prior to his visit.  His PCP had started him on amlodipine 2.5 mg and decreased his medication to 5 mg for better control.  He denied breakthrough episodes of atrial fibrillation.  He was cardiac unaware.  He denied fatigue and exertional dyspnea.  He was asymptomatic from a cardiac standpoint.  Bleeding issues and reported compliance with apixaban.  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  He stands most of the day.  He reports compliance with his apixaban.  He denies bleeding issues.  He denies palpitations and his cardiac unaware.  His EKG today shows atrial fibrillation/flutter.  We reviewed options for cardioversion, medical therapy, and ablation.  He expressed understanding.  I will continue his current medication, have him avoid triggers for palpitations, maintain his physical activity and plan follow-up in 12 months.  Today he denies chest pain, shortness of breath, lower extremity edema, fatigue, palpitations, melena, hematuria, hemoptysis, diaphoresis, weakness, presyncope, syncope, orthopnea, and PND.   Home Medications    Prior to Admission medications   Medication Sig Start Date End Date Taking? Authorizing Provider  amLODipine (NORVASC) 5 MG tablet Take 5 mg by mouth daily. 03/14/22   [provider]  apixaban (ELIQUIS) 5 MG TABS tablet Take 1 tablet (5 mg total) by mouth 2 (two) times  daily. 05/14/18   Azalee Course, PA  atorvastatin (LIPITOR) 10 MG tablet Take 10 mg by mouth daily.      [provider]  glimepiride (AMARYL) 1 MG tablet Take 1 mg by mouth daily with breakfast.    [provider]  lisinopril (PRINIVIL,ZESTRIL) 40 MG tablet Take 40 mg by mouth daily.      [provider]  metFORMIN (GLUCOPHAGE) 500 MG tablet Take 1,000 mg by mouth 2 (two) times daily with a  meal.     [provider]  NON FORMULARY OneTouch Delica Lancets 33 gauge    [provider]  NON FORMULARY OneTouch Verio test strips    [provider]  NON FORMULARY OneTouch Verio Meter    [provider]    Family History    Family History  Problem Relation Age of Onset   Depression Mother        suicide   Lung cancer Father    Healthy Brother    He indicated that his mother is deceased. He indicated that his father is deceased. He indicated that his brother is alive.  Social History    Social History   Socioeconomic History   Marital status: Married    Spouse name: Not on file   Number of children: Not on file   Years of education: Not on file   Highest education level: Not on file  Occupational History   Not on file  Tobacco Use   Smoking status: Former    Current packs/day: 0.00    Average packs/day: 1 pack/day for 4.0 years (4.0 ttl pk-yrs)    Types: Cigarettes    Start date: 71    Quit date: 51    Years since quitting: 45.5   Smokeless tobacco: Never  Substance and Sexual Activity   Alcohol use: Yes    Comment: socially - 1-7 / month   Drug use: No   Sexual activity: Not on file  Other Topics Concern   Not on file  Social History Narrative   Married - 2 children (adopted) with 4 grandchildren.   Works in [employer] business    2 cups coffee / day, 1 of tea.   Trying to get back to exercise - always up & about, on feet.  May do exercise bike ~30 min or more.   Social Determinants of Health   Financial Resource Strain: Not on file  Food Insecurity: Not on file  Transportation Needs: Not on file  Physical Activity: Not on file  Stress: Not on file  Social Connections: Not on file  Intimate Partner Violence: Not on file     Review of Systems    General:  No chills, fever, night sweats or weight changes.  Cardiovascular:  No chest pain, dyspnea on exertion, edema, orthopnea, palpitations, paroxysmal  nocturnal dyspnea. Dermatological: No rash, lesions/masses Respiratory: No cough, dyspnea Urologic: No hematuria, dysuria Abdominal:   No nausea, vomiting, diarrhea, bright red blood per rectum, melena, or hematemesis Neurologic:  No visual changes, wkns, changes in mental status. All other systems reviewed and are otherwise negative except as noted above.  Physical Exam    VS:  BP 112/80 (BP Location: Left Arm, Patient Position: Sitting, Cuff Size: Normal)   Pulse 70   Ht 5\' 5"  (1.651 m)   Wt 149 lb 12.8 oz (67.9 kg)   SpO2 95%   BMI 24.93 kg/m  , BMI Body mass index is 24.93 kg/m. GEN: Well nourished, well  developed, in no acute distress. HEENT: normal. Neck: Supple, no JVD, carotid bruits, or masses. Cardiac: RRR, no murmurs, rubs, or gallops. No clubbing, cyanosis, edema.  Radials/DP/PT 2+ and equal bilaterally.  Respiratory:  Respirations regular and unlabored, clear to auscultation bilaterally. GI: Soft, nontender, nondistended, BS + x 4. MS: no deformity or atrophy. Skin: warm and dry, no rash. Neuro:  Strength and sensation are intact. Psych: Normal affect.  Accessory Clinical Findings    Recent Labs: No results found for requested labs within last 365 days.   Recent Lipid Panel No results found for: "CHOL", "TRIG", "HDL", "CHOLHDL", "VLDL", "LDLCALC", "LDLDIRECT"       ECG personally reviewed by me today-atrial fibrillation septal infarct undetermined age 66 bpm  Echocardiogram 04/12/2018  LV EF: 55%   -------------------------------------------------------------------  Indications:     I48.91 Atrial fibrillation.   -------------------------------------------------------------------  History:  Risk factors:  Hypertension. Diabetes mellitus.  Dyslipidemia.   -------------------------------------------------------------------  Study Conclusions   - Left ventricle: The cavity size was normal. Wall thickness was    increased in a pattern of moderate LVH.  The estimated ejection    fraction was 55%. Doppler parameters are consistent with abnormal    left ventricular relaxation (grade 1 diastolic dysfunction).  - Aortic valve: There was trivial regurgitation.  - Left atrium: The atrium was mildly dilated.  - Right atrium: The atrium was mildly dilated.  - Atrial septum: A patent foramen ovale cannot be excluded.   -------------------------------------------------------------------  Study data:  No prior study was available for comparison.  Study  status:  Routine.  Procedure:  The patient reported no pain pre or  post test. Transthoracic echocardiography. Image quality was  adequate.  Study completion:  There were no complications.  Transthoracic echocardiography.  M-mode, complete 2D, spectral  Doppler, and color Doppler.  Birthdate:  Patient birthdate:  [birth year].  Age:  Patient is 77 yr old.  Sex:  Gender: male.  BMI: 23.6 kg/m^2.  Blood pressure:     139/91  Patient status:  Outpatient.  Study date:  Study date: 04/12/2018. Study time: 03:38  PM.  Location:  [facility]   -------------------------------------------------------------------   -------------------------------------------------------------------  Left ventricle:  The cavity size was normal. Wall thickness was  increased in a pattern of moderate LVH. The estimated ejection  fraction was 55%. Doppler parameters are consistent with abnormal  left ventricular relaxation (grade 1 diastolic dysfunction).   -------------------------------------------------------------------  Aortic valve:   Trileaflet; mildly thickened leaflets.  Doppler:  There was no stenosis.   There was trivial regurgitation.   -------------------------------------------------------------------  Aorta: The aorta was normal, not dilated, and non-diseased.   -------------------------------------------------------------------  Mitral valve:   Mildly thickened leaflets .  Doppler:  There was  trivial  regurgitation.    Peak gradient (D): 2 mm Hg.   -------------------------------------------------------------------  Left atrium:  The atrium was mildly dilated.   -------------------------------------------------------------------  Atrial septum:  A patent foramen ovale cannot be excluded.   -------------------------------------------------------------------  Right ventricle:  The cavity size was normal. Wall thickness was  normal. Systolic function was normal.   -------------------------------------------------------------------  Pulmonic valve:    Doppler:  There was mild regurgitation.   -------------------------------------------------------------------  Tricuspid valve:   Doppler:  There was mild regurgitation.   -------------------------------------------------------------------  Right atrium:  The atrium was mildly dilated.   -------------------------------------------------------------------  Pericardium: The pericardium was normal in appearance.   -------------------------------------------------------------------  Systemic veins:  Inferior vena cava: The vessel was normal in size. The  respirophasic diameter changes were in the normal range (>= 50%),  consistent with normal central venous pressure.   -------------------------------------------------------------------  Post procedure conclusions  Ascending Aorta:   - The aorta was normal, not dilated, and non-diseased.   Nuclear stress test 04/21/2018   Blood pressure demonstrated a normal response to exercise. There was no ST segment deviation noted during stress. The study is normal. Gated images not performed due to underlying atrial fibrillation. This is a low risk study.   Assessment & Plan   1.  Paroxysmal atrial fibrillation-EKG today shows atrial fibrillation with competing junctional pacemaker 75 bpm.  Reports compliance with apixaban.  Denies bleeding issues. Continue current medical therapy. Avoid  triggers caffeine, chocolate, EtOH, dehydration etc. For  Hyperlipidemia- LDL 60 on 02/04/23. Continue atorvastatin High-fiber diet Increase physical activity as tolerated  Essential hypertension BP today 112/80. Maintain blood pressure log Continue lisinopril, amlodipine Low-sodium diet   Type 2 diabetes-follows with PCP. Continue metformin, Amaryl  Disposition: Follow-up with Dr. Herbie Baltimore or me in 12 months.   Thomasene Ripple. Gasper Hopes NP-C     04/30/2023, 4:27 PM [facility] [address] Office [phone] Fax [phone]    I spent 14 minutes examining this patient, reviewing medications, and using patient centered shared decision making involving her cardiac care.  Prior to her visit I spent greater than 20 minutes reviewing her past medical history,  medications, and prior cardiac tests.

## 2023-04-24 ENCOUNTER — Ambulatory Visit: Payer: PPO | Admitting: General Practice

## 2023-04-30 ENCOUNTER — Encounter: Payer: Self-pay | Admitting: General Practice

## 2023-04-30 ENCOUNTER — Ambulatory Visit: Payer: PPO | Attending: General Practice | Admitting: General Practice

## 2023-04-30 VITALS — BP 112/80 | HR 70 | Ht 65.0 in | Wt 149.8 lb

## 2023-04-30 DIAGNOSIS — I10 Essential (primary) hypertension: Secondary | ICD-10-CM | POA: Diagnosis not present

## 2023-04-30 DIAGNOSIS — I480 Paroxysmal atrial fibrillation: Secondary | ICD-10-CM | POA: Diagnosis not present

## 2023-04-30 DIAGNOSIS — E8881 Metabolic syndrome: Secondary | ICD-10-CM

## 2023-04-30 DIAGNOSIS — E1169 Type 2 diabetes mellitus with other specified complication: Secondary | ICD-10-CM

## 2023-04-30 DIAGNOSIS — E785 Hyperlipidemia, unspecified: Secondary | ICD-10-CM | POA: Diagnosis not present

## 2023-04-30 NOTE — Patient Instructions (Addendum)
Medication Instructions:  No changes *If you need a refill on your cardiac medications before your next appointment, please call your pharmacy*  Follow-Up: At [facility], you and your health needs are our priority.  As part of our continuing mission to provide you with exceptional heart care, we have created designated Provider Care Teams.  These Care Teams include your primary Cardiologist (physician) and Advanced Practice Providers (APPs -  Physician Assistants and Nurse Practitioners) who all work together to provide you with the care you need, when you need it.  We recommend signing up for the patient portal called "MyChart".  Sign up information is provided on this After Visit Summary.  MyChart is used to connect with patients for Virtual Visits (Telemedicine).  Patients are able to view lab/test results, encounter notes, upcoming appointments, etc.  Non-urgent messages can be sent to your provider as well.   To learn more about what you can do with MyChart, go to [email or web address].    Your next appointment:   1 year  Provider:   Bryan Lemma, MD     Continue being physically active. Activity as tolerated  Palpitations Palpitations are feelings that your heartbeat is irregular or is faster than normal. It may feel like your heart is fluttering or skipping a beat. Palpitations may be caused by many things, including smoking, caffeine, alcohol, stress, and certain medicines or drugs. Most causes of palpitations are not serious.  However, some palpitations can be a sign of a serious problem. Further tests and a thorough medical history will be done to find the cause of your palpitations. Your provider may order tests such as an ECG, labs, an echocardiogram, or an ambulatory continuous ECG monitor. Follow these instructions at home: Pay attention to any changes in your symptoms. Let your health care provider know about them. Take these actions to help manage your  symptoms: Eating and drinking Follow instructions from your health care provider about eating or drinking restrictions. You may need to avoid foods and drinks that may cause palpitations. These may include: Caffeinated coffee, tea, soft drinks, and energy drinks. Chocolate. Alcohol. Diet pills. Lifestyle     Take steps to reduce your stress and anxiety. Things that can help you relax include: Yoga. Mind-body activities, such as deep breathing, meditation, or using words and images to create positive thoughts (guided imagery). Physical activity, such as swimming, jogging, or walking. Tell your health care provider if your palpitations increase with activity. If you have chest pain or shortness of breath with activity, do not continue the activity until you are seen by your health care provider. Biofeedback. This is a method that helps you learn to use your mind to control things in your body, such as your heartbeat. Get plenty of rest and sleep. Keep a regular bed time. Do not use drugs, including cocaine or ecstasy. Do not use marijuana. Do not use any products that contain nicotine or tobacco. These products include cigarettes, chewing tobacco, and vaping devices, such as e-cigarettes. If you need help quitting, ask your health care provider. General instructions Take over-the-counter and prescription medicines only as told by your health care provider. Keep all follow-up visits. This is important. These may include visits for further testing if palpitations do not go away or get worse. Contact a health care provider if: You continue to have a fast or irregular heartbeat for a long period of time. You notice that your palpitations occur more often. Get help right away if: You have  chest pain or shortness of breath. You have a severe headache. You feel dizzy or you faint. These symptoms may represent a serious problem that is an emergency. Do not wait to see if the symptoms will go away.  Get medical help right away. Call your local emergency services (911 in the [place] Do not drive yourself to the hospital. Summary Palpitations are feelings that your heartbeat is irregular or is faster than normal. It may feel like your heart is fluttering or skipping a beat. Palpitations may be caused by many things, including smoking, caffeine, alcohol, stress, certain medicines, and drugs. Further tests and a thorough medical history may be done to find the cause of your palpitations. Get help right away if you faint or have chest pain, shortness of breath, severe headache, or dizziness. This information is not intended to replace advice given to you by your health care provider. Make sure you discuss any questions you have with your health care provider. Document Revised: 02/20/2021 Document Reviewed: 02/20/2021 Elsevier Patient Education  2024 [employer].

## 2023-05-04 NOTE — Progress Notes (Unsigned)
Assessment/Plan:   Hemifacial spasm, left vs aberrant reinnveration after a mild CN VII palsy (pt denies)  -Had first injections with Dr. Terrace Arabia with [facility] on Feb 18, 2023.  Patient had ptosis on the left.  Patient also had significant numbers of injections on the right side of the face.  -Patient and I did discuss risks and benefits of any toxin, including the black box warning.  I would just recommend changing the pattern of injection and actually decreasing the dose.  I would also not do the right face, unless we need it later for cosmetic purposes.  He did not have any upper orbicularis oculi injections, and I saw twitching there.  I would continue the lower orbicularis oculi injections, but avoid the nasalis, as he did have facial droop with the last injections.  I would not do frontalis injections, as I see no purpose for those.  Patient stated that they also were pretty painful.  Discussed with him that we do not need to do EMG guidance for these, since there are no other muscles below the ones that we are going to target, which would make these less painful.  We did extensively discussed risk, benefits, and side effects, especially since he is on Eliquis.  There are increased bleeding risks.  We did talk about ptosis and incomplete closure of the eyelid.  We did talk about facial asymmetries as well.  We talked about the black box warning.  Patient asked several questions and I answered those to the best of my ability.  He wants to think about things and will get back to me.  The SE that he experienced are unfortunately well known with botox/xeomin.  -I did tell the patient that I would recommend an MRI of the brain just to make sure that the facial nerve itself looks good.  He is very claustrophobic and does not wish to proceed right now, even in an open scanner.  He can let me know if he changes his mind.  -Regardless of toxin type we use, patient would not be due for next injections until after  September 8 or insurance is not going to pay for it. Subjective:   Daniel Taylor was seen today in neurologic consultation at the request of Daisy Floro, MD.  The consultation is for the evaluation of hemifacial spasm.  Patient was previously at [facility] neurology with Dr. Terrace Arabia for the same.  Notes from [facility] neurology are reviewed.  Patient was seen for the first time there in February, 2024.  Symptoms of left eye/left cheek and face twitching started in 2023.  He had his first and only Xeomin injections on Feb 18, 2023 with Dr. Terrace Arabia.  She used 37.5 units.  Injection pattern was noted.  It is unclear why the right side of the face was injected on the first visit.  Patient did call back to their office on May 20 and it sounds like patient had drooping of the left eye.  Pt reports today some drooping of the L face and he felt that the speech was a little affected - "its not as crisp."  He contact he wears in the L eye feels irritated.  On top of that, he did not feel that the botox was significantly beneficial.    Pt states that the twitching started in September of 2023.  It didn't really get worse with time but didn't get better.  No hx of neuroimaging of the brain.  Twitching is  worse when chewing (demonstrates for me by chewing gum).      ALLERGIES:  No Known Allergies  CURRENT MEDICATIONS:  Outpatient Encounter Medications as of 05/06/2023  Medication Sig   amLODipine (NORVASC) 5 MG tablet Take 5 mg by mouth daily.   apixaban (ELIQUIS) 5 MG TABS tablet Take 1 tablet (5 mg total) by mouth 2 (two) times daily.   Ascorbic Acid (VITAMIN C) 1000 MG tablet Take 1,000 mg by mouth daily.   atorvastatin (LIPITOR) 10 MG tablet Take 10 mg by mouth daily.     BETA CAROTENE PO Take 1,000 Units by mouth daily.   Chromium Picolinate 500 MCG TABS Take 500 mcg by mouth daily.   Cinnamon 500 MG TABS Take 500 mg by mouth daily.   Coenzyme Q10 (CO Q-10) 50 MG CAPS Take 50 mg by mouth. One Capsule Twice a  week   glimepiride (AMARYL) 1 MG tablet Take 1 mg by mouth daily with breakfast.   Glucosamine-Chondroit-Vit C-Mn (GLUCOSAMINE 1500 COMPLEX) CAPS Take 1,500 capsules by mouth daily.   lisinopril (PRINIVIL,ZESTRIL) 40 MG tablet Take 40 mg by mouth daily.     metFORMIN (GLUCOPHAGE) 500 MG tablet Take 1,000 mg by mouth 2 (two) times daily with a meal.    Multiple Vitamin (MULTIVITAMIN) capsule Take 1 capsule by mouth daily.   NON FORMULARY OneTouch Delica Lancets 33 gauge   NON FORMULARY OneTouch Verio test strips   NON FORMULARY OneTouch Verio Meter   pioglitazone (ACTOS) 15 MG tablet Take 15 mg by mouth daily.   Zinc 50 MG TABS Take 50 mg by mouth daily.   Facility-Administered Encounter Medications as of 05/06/2023  Medication   incobotulinumtoxinA (XEOMIN) 50 units injection 50 Units    Objective:   PHYSICAL EXAMINATION:    VITALS:   Vitals:   05/06/23 1015  BP: 127/85  Pulse: 72  SpO2: 98%  Weight: 152 lb (68.9 kg)  Height: 5\' 5"  (1.651 m)    GEN:  Normal appears male in no acute distress.  Appears stated age. HEENT:  Normocephalic, atraumatic. The mucous membranes are moist. The superficial temporal arteries are without ropiness or tenderness. Cardiovascular: irreg irreg Lungs: Clear to auscultation bilaterally. Neck/Heme: There are no carotid bruits noted bilaterally.  NEUROLOGICAL: Orientation:  The patient is alert and oriented x 3.   Cranial nerves: There is L facial droop with decreased NL fold on the L.  There is intermittent asymmetric blink on the L.  There is symmetric smile.   Extraocular muscles are intact and visual fields are full to confrontational testing. Speech is fluent and clear. Soft palate rises symmetrically and there is no tongue deviation. Hearing is intact to conversational tone. Tone: Tone is good throughout. Sensation: Sensation is intact to light touch and pinprick throughout (facial, trunk, extremities). Vibration is intact at the bilateral big  toe. There is no extinction with double simultaneous stimulation. There is no sensory dermatomal level identified. Coordination:  The patient has no difficulty with RAM's or FNF bilaterally. Motor: Strength is 5/5 in the bilateral upper and lower extremities.  Shoulder shrug is equal and symmetric. There is no pronator drift.  There are no fasciculations noted. DTR's: Deep tendon reflexes are 2-2+/4 at the bilateral biceps, triceps, brachioradialis, patella and achilles.  Plantar responses are downgoing bilaterally. Gait and Station: The patient is able to ambulate without difficulty. The patient is able to heel toe walk without any difficulty. The patient is able to ambulate in a tandem fashion. The patient  is able to stand in the Romberg position. Abnormal movements:  rare twitch of the upper orbicularis oculi.  None of the lower today (just had botox/xeomin on 5/9).   Total time spent on today's visit was 45 minutes, including both face-to-face time and nonface-to-face time.  Time included that spent on review of records (prior notes available to me/labs/imaging if pertinent), discussing treatment and goals, answering patient's questions and coordinating care.   Cc:  Daisy Floro, MD

## 2023-05-06 ENCOUNTER — Ambulatory Visit: Payer: PPO | Admitting: Neurology

## 2023-05-06 ENCOUNTER — Encounter: Payer: Self-pay | Admitting: Neurology

## 2023-05-06 VITALS — BP 127/85 | HR 72 | Ht 65.0 in | Wt 152.0 lb

## 2023-05-06 DIAGNOSIS — R29810 Facial weakness: Secondary | ICD-10-CM | POA: Diagnosis not present

## 2023-05-06 DIAGNOSIS — G5132 Clonic hemifacial spasm, left: Secondary | ICD-10-CM | POA: Diagnosis not present

## 2023-05-06 NOTE — Patient Instructions (Signed)
It was good to see you today!  We discussed doing an MRI brain and you wanted to think about that.  We also discussed botox today.  You can let us know if you would like to proceed with authorization.  The physicians and staff at [facility] are committed to providing excellent care. You may receive a survey requesting feedback about your experience at our office. We strive to receive "very good" responses to the survey questions. If you feel that your experience would prevent you from giving the office a "very good " response, please contact our office to try to remedy the situation. We may be reached at [phone]. Thank you for taking the time out of your busy day to complete the survey.

## 2023-05-26 DIAGNOSIS — R195 Other fecal abnormalities: Secondary | ICD-10-CM | POA: Diagnosis not present

## 2023-05-26 DIAGNOSIS — K638219 Small intestinal bacterial overgrowth, unspecified: Secondary | ICD-10-CM | POA: Diagnosis not present

## 2023-05-26 DIAGNOSIS — R109 Unspecified abdominal pain: Secondary | ICD-10-CM | POA: Diagnosis not present

## 2023-06-03 ENCOUNTER — Ambulatory Visit: Payer: PPO | Admitting: Neurology

## 2023-06-10 DIAGNOSIS — E119 Type 2 diabetes mellitus without complications: Secondary | ICD-10-CM | POA: Diagnosis not present

## 2023-06-10 DIAGNOSIS — H2513 Age-related nuclear cataract, bilateral: Secondary | ICD-10-CM | POA: Diagnosis not present

## 2023-06-10 DIAGNOSIS — H5203 Hypermetropia, bilateral: Secondary | ICD-10-CM | POA: Diagnosis not present

## 2023-07-03 ENCOUNTER — Telehealth: Payer: Self-pay | Admitting: Cardiology

## 2023-07-03 NOTE — Telephone Encounter (Signed)
Patient would like to know if Verdon Cummins can complete dental clearance form being that dr.Harding will be out of office Monday and Tuesday. Verdon Cummins saw him last on 04/30/23

## 2023-07-03 NOTE — Telephone Encounter (Signed)
Paper Work Dropped Off: Dentist Apt Clearance   Date:07/03/2023  Location of paper: Dr. Herbie Baltimore

## 2023-07-03 NOTE — Telephone Encounter (Signed)
Pt states he would like for Edd Fabian to call him back regarding his clearance. Please advise

## 2023-07-03 NOTE — Telephone Encounter (Signed)
Left voicemail to return call to office

## 2023-07-03 NOTE — Telephone Encounter (Signed)
Duplicate encounter. Please see previous encounter.

## 2023-07-03 NOTE — Telephone Encounter (Signed)
Dentist clearance form -Dr. Herbie Baltimore mail box* Patient stated his dentist apt was Wednesday 07/08/2023.

## 2023-07-06 ENCOUNTER — Telehealth: Payer: Self-pay | Admitting: *Deleted

## 2023-07-06 NOTE — Telephone Encounter (Signed)
Dental /Medical clearance placed in pre- op  pool  ( In- basket to take care of)

## 2023-07-06 NOTE — Telephone Encounter (Signed)
Pre-operative Risk Assessment    Patient Name: Daniel Taylor  DOB: 06-Jun-1946 MRN: [id number]     Request for Surgical Clearance    Procedure:   Possible root  canal treatment  --- length of procedure 1 hour and 30 minutes  Date of Surgery:  Clearance TBD                                 Surgeon:  Dr Mila Merry DDS,PA Surgeon's Group or Practice Name:  Dr Mila Merry DDS,PA Phone number:  [phone] Fax number:  [phone]   Type of Clearance Requested:   - Medical  - Pharmacy:  Hold Apixaban (Eliquis)    Type of Anesthesia:  Local    Additional requests/questions:  Please advise surgeon/provider what medications should be held.  Daniel Taylor   07/06/2023, 4:38 PM

## 2023-07-06 NOTE — Telephone Encounter (Signed)
Patient Name: Daniel Taylor  DOB: 1946/02/20 MRN: [id number]  Primary Cardiologist: Bryan Lemma, MD  Chart reviewed as part of pre-operative protocol coverage.   Dental procedures of 1-2 teeth are considered low risk procedures per guidelines and generally do not require any specific cardiac clearance. It is also generally accepted that for extractions of 1-2 teeth, dental cleanings, and other procedures such as root canals, there is no need to interrupt blood thinner therapy.  SBE prophylaxis is not required for the patient from a cardiac standpoint.  I will route this recommendation to the requesting party via Epic fax function and remove from pre-op pool.  Please call with questions.  Carlos Levering, NP 07/06/2023, 5:03 PM

## 2023-07-06 NOTE — Telephone Encounter (Signed)
Pt calling back for an update. I informed him I will have nurse reach out for an update. He states it a root canal that's supposed to be on Wednesday and they want to know if he needs to hold eliquis

## 2023-07-07 ENCOUNTER — Telehealth: Payer: Self-pay | Admitting: Cardiology

## 2023-07-07 NOTE — Telephone Encounter (Signed)
Patient is having a root canal tomorrow and would like an update on the clearance. Please advise.

## 2023-07-07 NOTE — Telephone Encounter (Signed)
I revised D. Wittenborn's letter yesterday and re-faxed. We do not typically hold anticoagulation for root canals.   Can you please confirm they have received my fax from today?

## 2023-07-07 NOTE — Telephone Encounter (Signed)
Caller Hilda Lias) stated they will need a more definitive medical clearance as the patient will be having a root canal and will need his blood thinners held.  Caller noted patient's appointment is tomorrow (9/25).

## 2023-08-19 DIAGNOSIS — I4892 Unspecified atrial flutter: Secondary | ICD-10-CM | POA: Diagnosis not present

## 2023-08-19 DIAGNOSIS — I10 Essential (primary) hypertension: Secondary | ICD-10-CM | POA: Diagnosis not present

## 2023-08-19 DIAGNOSIS — E119 Type 2 diabetes mellitus without complications: Secondary | ICD-10-CM | POA: Diagnosis not present

## 2023-08-19 DIAGNOSIS — Z6826 Body mass index (BMI) 26.0-26.9, adult: Secondary | ICD-10-CM | POA: Diagnosis not present

## 2023-08-19 DIAGNOSIS — D6869 Other thrombophilia: Secondary | ICD-10-CM | POA: Diagnosis not present

## 2023-08-19 DIAGNOSIS — E1169 Type 2 diabetes mellitus with other specified complication: Secondary | ICD-10-CM | POA: Diagnosis not present

## 2023-08-19 DIAGNOSIS — E782 Mixed hyperlipidemia: Secondary | ICD-10-CM | POA: Diagnosis not present

## 2023-08-28 ENCOUNTER — Other Ambulatory Visit: Payer: Self-pay

## 2023-10-19 ENCOUNTER — Encounter: Payer: Self-pay | Admitting: Neurology

## 2023-10-27 NOTE — Progress Notes (Signed)
 Assessment/Plan:   Hemifacial spasm, left vs aberrant reinnveration after a mild CN VII palsy (pt denies)             -Had first injections with Dr. Gracie Lav with Xeomin  on Feb 18, 2023.  Patient had ptosis on the left.  Patient also had significant numbers of injections on the right side of the face for ? Reason.  He is clear that he doesn't want those done, even for cosmetic purposes.  He previously had no upper orbicularis oculi injections, and he has twitching in upper and lower orbicularis oculi today so we will do both.  We would do zygomaticus but avoid nasalis , as he did have facial droop with his previous (only) injections at [facility].  I also would not do the frontalis injections, as I really do not see any purpose for those.  Patient also complained that this were pretty painful, likely because they use EMG guidance.  We will not do that, as there is no muscle below the ones that we are going to target.  -declines mri brain  -will do MG panel.  Has just a bit of ptosis but think its from the spasm/twitching   Subjective:   Daniel Taylor was seen today.  I have only seen the patient one time and that was back in July.  He had a history of hemifacial spasm on the left versus aberrant reinnervation after a mild cranial nerve VII palsy (denied).  I had recommended that we go ahead and proceed with MRI of the brain to make sure the facial nerve itself looks good, but he declined that.  He wanted to get back with me about whether or not to continue to proceed with injections, but I did not hear from him until today.  His last injections were in May, 2024 by [facility] neurology.  He reports that his sx's are getting worse.  The L eye is twitching and it affects the vision on the L because the eye is twitching.  He notes that the L face pulls and he thinks that affects the speech.  No diplopia.  Nothing on the R side of the face.      CURRENT MEDICATIONS:  Outpatient Encounter Medications  as of 10/28/2023  Medication Sig   amLODipine (NORVASC) 5 MG tablet Take 5 mg by mouth daily.   apixaban  (ELIQUIS ) 5 MG TABS tablet Take 1 tablet (5 mg total) by mouth 2 (two) times daily.   Ascorbic Acid (VITAMIN C) 1000 MG tablet Take 1,000 mg by mouth daily.   atorvastatin  (LIPITOR) 10 MG tablet Take 10 mg by mouth daily.     BETA CAROTENE PO Take 1,000 Units by mouth daily.   Chromium Picolinate 500 MCG TABS Take 500 mcg by mouth daily.   Cinnamon 500 MG TABS Take 500 mg by mouth daily.   Coenzyme Q10 (CO Q-10) 50 MG CAPS Take 50 mg by mouth. One Capsule Twice a week   glimepiride (AMARYL) 1 MG tablet Take 1 mg by mouth daily with breakfast.   Glucosamine-Chondroit-Vit C-Mn (GLUCOSAMINE 1500 COMPLEX) CAPS Take 1,500 capsules by mouth daily.   lisinopril  (PRINIVIL ,ZESTRIL ) 40 MG tablet Take 40 mg by mouth daily.     metFORMIN  (GLUCOPHAGE ) 500 MG tablet Take 1,000 mg by mouth 2 (two) times daily with a meal.    Multiple Vitamin (MULTIVITAMIN) capsule Take 1 capsule by mouth daily.   NON FORMULARY OneTouch Delica Lancets 33 gauge   NON FORMULARY OneTouch  Verio test strips   NON FORMULARY OneTouch Verio Meter   pioglitazone (ACTOS) 15 MG tablet Take 15 mg by mouth daily.   Zinc 50 MG TABS Take 50 mg by mouth daily.   Facility-Administered Encounter Medications as of 10/28/2023  Medication   incobotulinumtoxinA  (XEOMIN ) 50 units injection 50 Units     Objective:   PHYSICAL EXAMINATION:    VITALS:   Vitals:   10/28/23 0819  BP: 120/82  Pulse: 68  Height: 5\' 4"  (1.626 m)    GEN:  Normal appears male in no acute distress.  Appears stated age. HEENT:  Normocephalic, atraumatic. The mucous membranes are moist. The superficial temporal arteries are without ropiness or tenderness. Cardiovascular: irreg irreg Lungs: Clear to auscultation bilaterally. Neck/Heme: There are no carotid bruits noted bilaterally.   NEUROLOGICAL: Orientation:  The patient is alert and oriented x 3.    Cranial nerves: There is very minimal decreased NL fold on the L but able to activate it symmetrically.  There is symmetric blink.  There is ?ptosis on the L.   There is symmetric smile.   Extraocular muscles are intact and visual fields are full to confrontational testing. Speech is fluent and clear. Soft palate rises symmetrically and there is no tongue deviation. Hearing is intact to conversational tone. Tone: Tone is good throughout. Sensation: Sensation is intact to light touch and pinprick throughout (facial, trunk, extremities). Vibration is intact at the bilateral big toe. There is no extinction with double simultaneous stimulation. There is no sensory dermatomal level identified. Coordination:  The patient has no difficulty with RAM's or FNF bilaterally. Motor: Strength is 5/5 in the bilateral upper and lower extremities.  Shoulder shrug is equal and symmetric. There is no pronator drift.  There are no fasciculations noted. Abnormal movements:  occ twitch of the L upper and lower orbicularis oculi and rare of the L zygomaticus    Total time spent on today's visit was 30 minutes, including both face-to-face time and nonface-to-face time.  Time included that spent on review of records (prior notes available to me/labs/imaging if pertinent), discussing treatment and goals, answering patient's questions and coordinating care.  Cc:  Jimmey Mould, MD

## 2023-10-28 ENCOUNTER — Telehealth: Payer: Self-pay | Admitting: Pharmacy Technician

## 2023-10-28 ENCOUNTER — Other Ambulatory Visit: Payer: PPO

## 2023-10-28 ENCOUNTER — Encounter: Payer: Self-pay | Admitting: Neurology

## 2023-10-28 ENCOUNTER — Other Ambulatory Visit (HOSPITAL_COMMUNITY): Payer: Self-pay

## 2023-10-28 ENCOUNTER — Other Ambulatory Visit: Payer: Self-pay

## 2023-10-28 ENCOUNTER — Ambulatory Visit: Payer: PPO | Admitting: Neurology

## 2023-10-28 VITALS — BP 120/82 | HR 68 | Ht 64.0 in

## 2023-10-28 DIAGNOSIS — G5132 Clonic hemifacial spasm, left: Secondary | ICD-10-CM

## 2023-10-28 DIAGNOSIS — H02402 Unspecified ptosis of left eyelid: Secondary | ICD-10-CM

## 2023-10-28 DIAGNOSIS — R29810 Facial weakness: Secondary | ICD-10-CM

## 2023-10-28 NOTE — Telephone Encounter (Signed)
 Pharmacy Patient Advocate Encounter  [employer] verification has been submitted. Benefit Verification #:   [id number]  Pharmacy PA has been submitted for BOTOX 100u via CoverMyMeds. INSURANCE: [employer]VANT[employer]/RX ADVANCE DATE SUBMITTED: 1.15.25 KEY: [id number] Status is pending

## 2023-10-28 NOTE — Patient Instructions (Signed)
Your provider has requested that you have labwork completed today. The lab is located on the Second floor at [address], within the  Endocrinology office. When you get off the elevator, turn right and go in the  Endocrinology [address]; the first brown door on the left.  Tell the ladies behind the desk that you are there for lab work. If you are not called within 15 minutes please check with the front desk.   Once you complete your labs you are free to go. You will receive a call or message via MyChart with your lab results.    

## 2023-10-30 ENCOUNTER — Telehealth: Payer: Self-pay | Admitting: Neurology

## 2023-10-30 NOTE — Telephone Encounter (Signed)
Pt came in stating he was supposed to be getting botox and he spoke with the insurance and was told it was denied. The insurance told him it was submitted as a medical claim last year at his old office and not a pharmacy claim like it was this year. He stated the prior auth needs to go through medical not pharmacy. He would like a call back with an update.

## 2023-11-03 LAB — MYASTHENIA GRAVIS PANEL 2
A CHR BINDING ABS: <0.3 nmol/L
ACHR Blocking Abs: <15 %{inhibition} (ref <15)
Acetylchol Modul Ab: 17 %{inhibition}

## 2023-11-09 NOTE — Telephone Encounter (Signed)
Pharmacy Patient Advocate Encounter  Received notification from [facility] ADVANTAGE/RX ADVANCE that Prior Authorization for Botox has been DENIED.  Full denial letter will be uploaded to the media tab. See denial reason below.  Your request was denied because it is being used for an indication which is NOT approved or medically-accepted: Hemifacial spasms.  PA #/Case ID/Reference #: [id number] (e-appeal available)

## 2023-11-10 ENCOUNTER — Other Ambulatory Visit (HOSPITAL_COMMUNITY): Payer: Self-pay

## 2023-11-10 NOTE — Telephone Encounter (Signed)
From what I can find in his chart, he was on Xeomin previously with [facility]. This was done as buy & bill. There is a benefit verification report in his media tab from 01/13/23. It appears he has the same insurance. [employer]. & it is not on formulary for his pharmacy benefit. I suspect a PA would return the same denial as the Botox.

## 2023-11-12 NOTE — Telephone Encounter (Signed)
Patient has called [employer] and the PA had been run through Medicare Part D pharmacy benefits and it was approved before through medical benefits for [employer]. Patient brought me all documentation the denial letter and the approval from [employer] when it was approved through [employer]. I have sent all the documents to PA team to look at and let me know the next steps

## 2023-11-12 NOTE — Telephone Encounter (Signed)
Sent to Monchell at the PA team and I just looked at chart notes and it looks like Adrienne responded back but did not route to you. In this case according to the [employer] report, patient does not need a PA for medical benefits. Yes it is denied under pharmacy benefits, BUT no PA is needed if processed through [employer] [place] [employer] [place].  MB The [employer] [facility] report is also in the media for reference if needed

## 2023-11-13 NOTE — Telephone Encounter (Signed)
Sent to Monchell at the PA team and I just looked at chart notes and it looks like Adrienne responded back but did not route to you. In this case according to the [employer] report, patient does not need a PA for medical benefits. Yes it is denied under pharmacy benefits, BUT no PA is needed if processed through [employer] [place] [employer].  MB The [employer] report is also in the media for reference i

## 2023-11-18 DIAGNOSIS — R972 Elevated prostate specific antigen [PSA]: Secondary | ICD-10-CM | POA: Diagnosis not present

## 2023-12-11 ENCOUNTER — Ambulatory Visit: Payer: PPO | Admitting: Neurology

## 2023-12-11 DIAGNOSIS — G5132 Clonic hemifacial spasm, left: Secondary | ICD-10-CM

## 2023-12-11 MED ORDER — INCOBOTULINUMTOXINA 100 UNITS IM SOLR
100.0000 [IU] | INTRAMUSCULAR | Status: AC
  Administered 2023-12-11: 25 [IU] via INTRAMUSCULAR

## 2023-12-11 NOTE — Procedures (Signed)
 [facility]   History:  Diagnosis: Hemifacial spasm   Initial side: left   Result History  Onset of effect: n/a  Duration of Benefit: n/a  Adverse Effects: n/a   Consent obtained from: The patient Benefits discussed included, but were not limited to decreased muscle tightness, increased joint range of motion, and decreased pain.  Risk discussed included, but were not limited pain and discomfort, bleeding, bruising, excessive weakness, venous thrombosis, muscle atrophy and dysphagia.  A copy of the patient medication guide was given to the patient which explains the blackbox warning.  Patients identity and treatment sites confirmed Yes.  .  Details of Procedure: Skin was cleaned with alcohol.  A 30 gauge, 25 mm needle was introduced to the target muscle.  Prior to injection, the needle plunger was aspirated to make sure the needle was not within a blood vessel.  There was no blood retrieved on aspiration.    Following is a summary of the muscles injected  And the amount of Botulinum toxin used:  Injections  Location Left  Right Units Number of sites        Corrugator      Frontalis      Lower Lid, Lateral 2.5  2.5 1  Lower Lid Medial 2.5  2.5 1  Upper Lid, Lateral 2.5  2.5 1  Upper Lid, Medial 2.5  2.5 1  Canthus 5.0  5.0 1  Temporalis      Masseter      Procerus      Zygomaticus Major 2.5 x 4  10   TOTAL UNITS:   25    Agent: Incobotulinumtoxin A (Xeomin) 1 vials of Botox were used, each containing 50 units and freshly diluted with 2 mL of sterile, non-perserved saline   Total injected (Units):  25  Total wasted (Units):  75 Pt tolerated procedure well without complications.   Reinjection is anticipated in 3 months.

## 2024-01-21 NOTE — Progress Notes (Unsigned)
 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  This virtually mediated the twitching around the eye, but he had difficulty with his contact after that.  Looking at him today, I think that it caused dry eyes because of mild decreased blink on the left.  We discussed lubricating the eye as well and putting Lacri-Lube in at night.  We discussed that we could take out the medial orbicularis oculi injection, but ultimately he decided to just hold on the injections for now.  He will let me know if he would like to proceed with close in the future.             -Had first injections with Dr. Terrace Arabia with [facility] on Feb 18, 2023.  Patient had ptosis on the left.  Patient also had significant numbers of injections on the right side of the face for ? Reason.  He is clear that he doesn't want those done, even for cosmetic purposes.  He previously had no upper orbicularis oculi injections, and he has twitching in upper and lower orbicularis oculi today so we will do both.  We would do zygomaticus but avoid nasalis , as he did have facial droop with his previous (only) injections at [facility] neurology.  I also would not do the frontalis injections, as I really do not see any purpose for those.  Patient also complained that this were pretty painful, likely because they use EMG guidance.  We will not do that, as there is no muscle below the ones that we are going to target.             -declines mri brain             -will do MG panel.  Has just a bit of ptosis but think its from the spasm/twitching   Subjective:   Daniel Taylor was seen today in follow up for hemifacial spasm.  He had a myasthenia panel done since our last visit as he had just a little bit of ptosis last time and that panel was negative.  He had first injections with me on February 28.  He had 25 units of Xeomin.  He reports today that the  botox helped the twitching around the L eye, pretty much "eliminating it." However, he notes that he cannot wear the contact in the L eye as he feels that the eye won't open all the way but "it is."  He still has lower eyelid twitch.      CURRENT MEDICATIONS:  Outpatient Encounter Medications as of 01/22/2024  Medication Sig   amLODipine (NORVASC) 5 MG tablet Take 5 mg by mouth daily.   apixaban (ELIQUIS) 5 MG TABS tablet Take 1 tablet (5 mg total) by mouth 2 (two) times daily.   Ascorbic Acid (VITAMIN C) 1000 MG tablet Take 1,000 mg by mouth daily.   atorvastatin (LIPITOR) 10 MG tablet Take 10 mg by mouth daily.     BETA CAROTENE PO Take 1,000 Units by mouth daily.   Chromium Picolinate 500 MCG TABS Take 500 mcg by mouth daily.   Cinnamon 500 MG TABS Take 500 mg by mouth daily.   Coenzyme Q10 (CO Q-10) 50 MG CAPS Take 50 mg by mouth. One Capsule Twice a week   glimepiride (AMARYL) 1 MG tablet Take 1 mg by mouth daily with breakfast.   Glucosamine-Chondroit-Vit C-Mn (GLUCOSAMINE  1500 COMPLEX) CAPS Take 1,500 capsules by mouth daily.   lisinopril (PRINIVIL,ZESTRIL) 40 MG tablet Take 40 mg by mouth daily.     metFORMIN (GLUCOPHAGE) 500 MG tablet Take 1,000 mg by mouth 2 (two) times daily with a meal.    Multiple Vitamin (MULTIVITAMIN) capsule Take 1 capsule by mouth daily.   NON FORMULARY OneTouch Delica Lancets 33 gauge   NON FORMULARY OneTouch Verio test strips   NON FORMULARY OneTouch Verio Meter   pioglitazone (ACTOS) 15 MG tablet Take 15 mg by mouth daily.   Zinc 50 MG TABS Take 50 mg by mouth daily.   Facility-Administered Encounter Medications as of 01/22/2024  Medication   incobotulinumtoxinA (XEOMIN) 100 units injection 100 Units   incobotulinumtoxinA (XEOMIN) 50 units injection 50 Units     Objective:   PHYSICAL EXAMINATION:    VITALS:   Vitals:   01/22/24 1403  BP: 124/80  Pulse: 73  SpO2: 96%    GEN:  The patient appears stated age and is in NAD. HEENT:   Normocephalic, atraumatic.    Neurological examination:  Orientation:  The patient is alert and oriented x 3.   Cranial nerves: There is very minimal decreased NL fold on the L but able to activate it symmetrically.  This is stable from prior to injections.  There is very slight decreased blink on the left.  There is no ptosis today.   There is symmetric smile.  There is symmetric forehead wrinkle.  Extraocular muscles are intact and visual fields are full to confrontational testing. Speech is fluent and clear. Soft palate rises symmetrically and there is no tongue deviation. Hearing is intact to conversational tone. Tone: Tone is good throughout. Sensation: Sensation is intact to light touch throughout. Coordination:  The patient has no difficulty with RAM's or FNF bilaterally. Motor: Strength is at least antigravity x 4. Abnormal movements: No twitching at all of the orbicularis oculi or zygomaticus today.    Cc:  Daisy Floro, MD

## 2024-01-22 ENCOUNTER — Ambulatory Visit: Payer: PPO | Admitting: Neurology

## 2024-01-22 ENCOUNTER — Encounter: Payer: Self-pay | Admitting: Neurology

## 2024-01-22 VITALS — BP 124/80 | HR 73

## 2024-01-22 DIAGNOSIS — G5132 Clonic hemifacial spasm, left: Secondary | ICD-10-CM | POA: Diagnosis not present

## 2024-01-27 DIAGNOSIS — H10413 Chronic giant papillary conjunctivitis, bilateral: Secondary | ICD-10-CM | POA: Diagnosis not present

## 2024-02-03 ENCOUNTER — Ambulatory Visit: Payer: PPO | Admitting: Neurology

## 2024-02-04 ENCOUNTER — Ambulatory Visit: Payer: PPO | Admitting: Neurology

## 2024-03-11 ENCOUNTER — Ambulatory Visit: Payer: PPO | Admitting: Neurology

## 2024-03-14 ENCOUNTER — Encounter: Payer: Self-pay | Admitting: Cardiology

## 2024-04-13 DIAGNOSIS — E1169 Type 2 diabetes mellitus with other specified complication: Secondary | ICD-10-CM | POA: Diagnosis not present

## 2024-04-13 DIAGNOSIS — I10 Essential (primary) hypertension: Secondary | ICD-10-CM | POA: Diagnosis not present

## 2024-04-13 DIAGNOSIS — Z125 Encounter for screening for malignant neoplasm of prostate: Secondary | ICD-10-CM | POA: Diagnosis not present

## 2024-04-13 DIAGNOSIS — E782 Mixed hyperlipidemia: Secondary | ICD-10-CM | POA: Diagnosis not present

## 2024-04-22 DIAGNOSIS — I10 Essential (primary) hypertension: Secondary | ICD-10-CM | POA: Diagnosis not present

## 2024-04-22 DIAGNOSIS — Z0000 Encounter for general adult medical examination without abnormal findings: Secondary | ICD-10-CM | POA: Diagnosis not present

## 2024-04-22 DIAGNOSIS — E782 Mixed hyperlipidemia: Secondary | ICD-10-CM | POA: Diagnosis not present

## 2024-04-22 DIAGNOSIS — I4892 Unspecified atrial flutter: Secondary | ICD-10-CM | POA: Diagnosis not present

## 2024-04-22 DIAGNOSIS — Z6826 Body mass index (BMI) 26.0-26.9, adult: Secondary | ICD-10-CM | POA: Diagnosis not present

## 2024-04-22 DIAGNOSIS — E1169 Type 2 diabetes mellitus with other specified complication: Secondary | ICD-10-CM | POA: Diagnosis not present

## 2024-05-11 DIAGNOSIS — R972 Elevated prostate specific antigen [PSA]: Secondary | ICD-10-CM | POA: Diagnosis not present

## 2024-05-17 ENCOUNTER — Encounter: Payer: Self-pay | Admitting: Cardiology

## 2024-05-17 ENCOUNTER — Ambulatory Visit: Attending: Cardiology | Admitting: Cardiology

## 2024-05-17 VITALS — BP 110/70 | HR 67 | Ht 65.0 in | Wt 156.8 lb

## 2024-05-17 DIAGNOSIS — E1169 Type 2 diabetes mellitus with other specified complication: Secondary | ICD-10-CM | POA: Diagnosis not present

## 2024-05-17 DIAGNOSIS — I4821 Permanent atrial fibrillation: Secondary | ICD-10-CM | POA: Diagnosis not present

## 2024-05-17 DIAGNOSIS — I10 Essential (primary) hypertension: Secondary | ICD-10-CM

## 2024-05-17 DIAGNOSIS — E8881 Metabolic syndrome: Secondary | ICD-10-CM

## 2024-05-17 DIAGNOSIS — E785 Hyperlipidemia, unspecified: Secondary | ICD-10-CM | POA: Diagnosis not present

## 2024-05-17 NOTE — Patient Instructions (Addendum)

## 2024-05-17 NOTE — Progress Notes (Signed)
 Cardiology Office Note:  .   Date:  05/23/2024  ID:  Daniel Taylor, DOB 06-06-1946, MRN [id number] PCP: Okey Carlin Redbird, MD  [facility] Providers Cardiologist:  Alm Clay, MD     Chief Complaint  Patient presents with   Follow-up    Doing well.  No issues   Atrial Fibrillation    Pretty much asymptomatic   Hypertension    Well-controlled    Patient Profile: .     Daniel Taylor is a  78 y.o. male with a PMH notable for Permanent Atrial Fibrillation (on apixaban  and not on rate control; CHA2DS2-VASc score 3), HTN, HLD and DM-2 who presents here for delayed annual follow-up at the request of Okey Carlin Redbird, MD.  I last saw Daniel Taylor back in May 2022.  At that time, he was still paroxysmal A-fib.  Noted rate controlled without AV nodal agents.  Opted to avoid antiarrhythmics because he is relatively asymptomatic.  No changes made     Daniel Taylor was last seen on 05/01/2023 by Harlene Ferrier, NP.  Daniel Taylor was calculated out of 3.  Echo showed preserved EF.  Relatively normal valves.  Reviewed Myoview  stress test that was negative for the past.  PCP started amlodipine 5 mg daily after increasing to 2.5 mg along with lisinopril  for blood pressure control.  Mild nuisance bleeding from his apixaban  but otherwise no cardiac symptoms.  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  No sensation of A-fib or any palpitations, despite EKG showing A-fib with rate control.  They decided at that time to simply treat with anticoagulation as he was auto rate controlled.  At this point he became permanent A-fib with no plans for further rhythm control rather be AAD, cardioversion or even ablation.  Discussed potential triggers for driving heart rate faster.  Subjective  Discussed the use of AI scribe software for clinical note transcription with the patient, who gave verbal consent to proceed.  History of Present  Illness  Daniel Taylor is a 78 year old male with atrial fibrillation who presents for a follow-up visit.  He has a history of atrial fibrillation, which he rarely notices. Occasionally, he feels 'something's different' but is mostly unaware of being in atrial fibrillation. He is currently taking Eliquis  without experiencing any bleeding issues such as melena, epistaxis, or hemoptysis.  He underwent a stress test in July 2019, which showed normal function with no ischemia or infarction. An echocardiogram the same month revealed moderate ventricular thickening with normal systolic function and grade one diastolic dysfunction, considered normal for his age. His atria were slightly dilated but not significantly concerning.  He experiences a 'little fluttering' around his left eye, described as blepharospasm. He previously tried Botox injections, which provided minimal relief but interfered with his contact lens use, leading to discontinuation.  He has a history of diabetes managed with medication, keeping his A1c slightly over seven. No symptoms suggestive of sleep apnea such as snoring or observed apneas. He maintains an active lifestyle during the day but occasionally falls asleep while watching TV after dinner. No chest pain, pressure, shortness of breath, or palpitations. He reports occasional tiredness and yawning, attributing it to poor sleep at night.  Cardiovascular ROS: no chest pain or dyspnea on exertion positive for - mostly cardiac unaware but occasionally feels palpitations.  Maybe if heart rate will go faster with exercise negative for - edema,  orthopnea, paroxysmal nocturnal dyspnea, rapid heart rate, shortness of breath, or lightheadedness, dizziness or wooziness, syncope/near-syncope or TIA/amaurosis fugax, claudication  ROS:  Review of Systems - Negative except potential symptoms noted above.  No melena, hematochezia, hematuria or epistaxis    Objective   Medications - Eliquis   5 mg twice daily - Amlodipine 5 mg daily; - Lisinopril  40 mg daily - Pioglitazone 45 mg daily; glimepiride 1 mg daily - Atorvastatin  10 mg daily; CoQ 1050 mg weekly  Studies Reviewed: SABRA   EKG Interpretation Date/Time:  Tuesday May 17 2024 16:33:43 EDT Ventricular Rate:  67 PR Interval:    QRS Duration:  88 QT Interval:  394 QTC Calculation: 416 R Axis:   55  Text Interpretation: Atrial fibrillation Nonspecific T wave abnormality When compared with ECG of 30-Apr-2023 15:58, Criteria for Septal infarct are no longer Present Confirmed by Anner Lenis ([id number]) on 05/23/2024 8:19:24 PM    Labs from PCP via [facility]: 04/13/2024: TC 151, HDL 84, LDL 54, TG 64; A1c 7.4.  K+ 5.0. 11/12/2022: Hgb 15.8.  Previously Reviewed Cardiac Studies: Myoview  stress Test: Normal function, no ischemia or infarction, low risk (04/2018) Echocardiogram: Moderate ventricular hypertrophy, normal ejection fraction, grade one diastolic dysfunction, mild atrial dilation (04/2018)   Risk Assessment/Calculations:    CHA2DS2-VASc Score = 4   This indicates a 4.8% annual risk of stroke. The patient's score is based upon: CHF History: 0 HTN History: 1 Diabetes History: 1 Stroke History: 0 Vascular Disease History: 0 Age Score: 2 Gender Score: 0     STOP-Bang Score:  4     No snoring - sleep etiquette related    Physical Exam:   VS:  BP 110/70 (BP Location: Left Arm, Patient Position: Sitting, Cuff Size: Normal)   Pulse 67   Ht 5' 5 (1.651 m)   Wt 156 lb 12.8 oz (71.1 kg)   SpO2 94%   BMI 26.09 kg/m    Wt Readings from Last 3 Encounters:  05/17/24 156 lb 12.8 oz (71.1 kg)  05/06/23 152 lb (68.9 kg)  04/30/23 149 lb 12.8 oz (67.9 kg)    GEN: Well nourished, well groomed in no acute distress; healthy appearing NECK: No JVD; No carotid bruits CARDIAC: Normal S1, S2; RRR, no murmurs, rubs, gallops RESPIRATORY:  Clear to auscultation without rales, wheezing or rhonchi ; nonlabored, good air  movement. ABDOMEN: Soft, non-tender, non-distended EXTREMITIES:  No edema; No deformity     ASSESSMENT AND PLAN: .    Problem List Items Addressed This Visit       Cardiology Problems   Essential hypertension (Chronic)   BP was borderline elevated last visit K[facility]war[facility] in 2022, but notably improved now on combination of amlodipine 5 mg daily and lisinopril  40 mg daily. Not on diuretic which may Of treatment.  - Continue amlodipine 5 mg daily and lisinopril  40 mg daily.      Relevant Orders   EKG 12-Lead (Completed)   Hyperlipidemia due to type 2 diabetes mellitus (HCC) (Chronic)   Outstanding lipid control on 10 mg atorvastatin . - Continue current dose of atorvastatin -   A1c unfortunate, little elevated at 7.4. Defer management to PCP, but would consider SGLT2 inhibitor over pioglitazone. -Continue metformin  500 mg twice daily, along with glimepiride.       Relevant Medications   pioglitazone (ACTOS) 45 MG tablet   Other Relevant Orders   EKG 12-Lead (Completed)   Permanent atrial fibrillation (HCC) - Primary (Chronic)   Permanent AFib with controlled heart rate  in the absence of rate control agent, asymptomatic.  Stroke risk managed with Eliquis .  Discussed stroke risk if Eliquis  is stopped and importance of resuming.  Explained continuous AFib may pose less stroke risk than intermittent.  Potential future pacemaker need if conduction system fails, currently no symptoms. No bleeding issues on Eliquis . - Continue Eliquis  5 mg twice daily for stroke prevention. - Not requiring rate control  - Educate on signs of conduction system failure and potential need for pacemaker. - Monitor for symptoms such as fatigue, near syncope, or syncope.        Other   Metabolic syndrome (Chronic)   BP controlled.  Lipids controlled including triglyceride and HDL.  A1c 7.4 on oral medications.   Discussed importance of staying active and maintaining adequate lipid Owen BP.  Dietary  adjustment for diabetes.              Follow-Up: Return in about 1 year (around 05/17/2025) for 1 Yr Follow-up, [facility] office.     Signed, Alm MICAEL Clay, MD, MS Alm Clay, M.D., M.S. Interventional Chartered certified accountant  Pager # [phone]

## 2024-05-23 ENCOUNTER — Encounter: Payer: Self-pay | Admitting: Cardiology

## 2024-05-23 NOTE — Assessment & Plan Note (Signed)
 Permanent AFib with controlled heart rate in the absence of rate control agent, asymptomatic.  Stroke risk managed with Eliquis .  Discussed stroke risk if Eliquis  is stopped and importance of resuming.  Explained continuous AFib may pose less stroke risk than intermittent.  Potential future pacemaker need if conduction system fails, currently no symptoms. No bleeding issues on Eliquis . - Continue Eliquis  5 mg twice daily for stroke prevention. - Not requiring rate control  - Educate on signs of conduction system failure and potential need for pacemaker. - Monitor for symptoms such as fatigue, near syncope, or syncope.

## 2024-05-23 NOTE — Assessment & Plan Note (Signed)
 BP controlled.  Lipids controlled including triglyceride and HDL.  A1c 7.4 on oral medications.   Discussed importance of staying active and maintaining adequate lipid Owen BP.  Dietary adjustment for diabetes.

## 2024-05-23 NOTE — Assessment & Plan Note (Signed)
 BP was borderline elevated last visit Koneswaran in 2022, but notably improved now on combination of amlodipine 5 mg daily and lisinopril  40 mg daily. Not on diuretic which may Of treatment.  - Continue amlodipine 5 mg daily and lisinopril  40 mg daily.

## 2024-05-23 NOTE — Assessment & Plan Note (Addendum)
 Outstanding lipid control on 10 mg atorvastatin . - Continue current dose of atorvastatin -   A1c unfortunate, little elevated at 7.4. Defer management to PCP, but would consider SGLT2 inhibitor over pioglitazone. -Continue metformin  500 mg twice daily, along with glimepiride.

## 2024-05-24 DIAGNOSIS — M7062 Trochanteric bursitis, left hip: Secondary | ICD-10-CM | POA: Diagnosis not present

## 2024-05-25 DIAGNOSIS — R972 Elevated prostate specific antigen [PSA]: Secondary | ICD-10-CM | POA: Diagnosis not present

## 2024-05-25 DIAGNOSIS — R3912 Poor urinary stream: Secondary | ICD-10-CM | POA: Diagnosis not present

## 2024-05-25 DIAGNOSIS — R350 Frequency of micturition: Secondary | ICD-10-CM | POA: Diagnosis not present

## 2024-05-25 DIAGNOSIS — N401 Enlarged prostate with lower urinary tract symptoms: Secondary | ICD-10-CM | POA: Diagnosis not present

## 2024-06-07 DIAGNOSIS — M7062 Trochanteric bursitis, left hip: Secondary | ICD-10-CM | POA: Diagnosis not present

## 2024-06-08 DIAGNOSIS — R195 Other fecal abnormalities: Secondary | ICD-10-CM | POA: Diagnosis not present

## 2024-06-08 DIAGNOSIS — K638219 Small intestinal bacterial overgrowth, unspecified: Secondary | ICD-10-CM | POA: Diagnosis not present

## 2024-06-09 DIAGNOSIS — R399 Unspecified symptoms and signs involving the genitourinary system: Secondary | ICD-10-CM | POA: Diagnosis not present

## 2024-06-09 DIAGNOSIS — J029 Acute pharyngitis, unspecified: Secondary | ICD-10-CM | POA: Diagnosis not present

## 2024-06-15 DIAGNOSIS — E119 Type 2 diabetes mellitus without complications: Secondary | ICD-10-CM | POA: Diagnosis not present

## 2024-06-15 DIAGNOSIS — H524 Presbyopia: Secondary | ICD-10-CM | POA: Diagnosis not present

## 2024-06-15 DIAGNOSIS — H2513 Age-related nuclear cataract, bilateral: Secondary | ICD-10-CM | POA: Diagnosis not present

## 2024-07-06 DIAGNOSIS — I480 Paroxysmal atrial fibrillation: Secondary | ICD-10-CM | POA: Diagnosis not present

## 2024-07-06 DIAGNOSIS — E1165 Type 2 diabetes mellitus with hyperglycemia: Secondary | ICD-10-CM | POA: Diagnosis not present

## 2024-07-06 DIAGNOSIS — Z6827 Body mass index (BMI) 27.0-27.9, adult: Secondary | ICD-10-CM | POA: Diagnosis not present

## 2024-07-06 DIAGNOSIS — M25473 Effusion, unspecified ankle: Secondary | ICD-10-CM | POA: Diagnosis not present

## 2024-08-23 DIAGNOSIS — M7062 Trochanteric bursitis, left hip: Secondary | ICD-10-CM | POA: Diagnosis not present

## 2024-08-30 DIAGNOSIS — R109 Unspecified abdominal pain: Secondary | ICD-10-CM | POA: Diagnosis not present
